# Patient Record
Sex: Female | Born: 2000 | Race: White | Hispanic: No | Marital: Single | Smoking: Never smoker
Health system: Southern US, Community
[De-identification: ages and names within clinical notes are randomized; demographics above are authoritative.]

## PROBLEM LIST (undated history)

## (undated) DIAGNOSIS — F419 Anxiety disorder, unspecified: Secondary | ICD-10-CM

## (undated) DIAGNOSIS — F32A Depression, unspecified: Secondary | ICD-10-CM

## (undated) DIAGNOSIS — B009 Herpesviral infection, unspecified: Secondary | ICD-10-CM

## (undated) HISTORY — DX: Herpesviral infection, unspecified: B00.9

## (undated) HISTORY — PX: WISDOM TOOTH EXTRACTION: SHX21

## (undated) HISTORY — DX: Depression, unspecified: F32.A

## (undated) HISTORY — DX: Anxiety disorder, unspecified: F41.9

---
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
## 2019-04-05 ENCOUNTER — Other Ambulatory Visit: Payer: Self-pay | Admitting: Psychiatry

## 2019-04-11 ENCOUNTER — Other Ambulatory Visit: Payer: Self-pay

## 2019-04-11 ENCOUNTER — Encounter: Payer: Self-pay | Admitting: Psychiatry

## 2019-04-11 ENCOUNTER — Ambulatory Visit (INDEPENDENT_AMBULATORY_CARE_PROVIDER_SITE_OTHER): Payer: 59 | Admitting: Psychiatry

## 2019-04-11 VITALS — Ht 63.0 in | Wt 130.0 lb

## 2019-04-11 DIAGNOSIS — F411 Generalized anxiety disorder: Secondary | ICD-10-CM

## 2019-04-11 DIAGNOSIS — F325 Major depressive disorder, single episode, in full remission: Secondary | ICD-10-CM

## 2019-04-11 MED ORDER — ESCITALOPRAM OXALATE 10 MG PO TABS: 10.0000 mg | ORAL_TABLET | Freq: Every day | ORAL | 1 refills | Status: DC
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
## 2019-06-25 ENCOUNTER — Other Ambulatory Visit: Payer: Self-pay

## 2019-06-25 ENCOUNTER — Ambulatory Visit: Payer: Managed Care, Other (non HMO) | Admitting: Obstetrics & Gynecology

## 2019-06-25 ENCOUNTER — Encounter: Payer: Self-pay | Admitting: Obstetrics & Gynecology

## 2019-06-25 VITALS — BP 118/76

## 2019-06-25 DIAGNOSIS — Z113 Encounter for screening for infections with a predominantly sexual mode of transmission: Secondary | ICD-10-CM

## 2019-06-25 DIAGNOSIS — A6000 Herpesviral infection of urogenital system, unspecified: Secondary | ICD-10-CM

## 2019-06-25 DIAGNOSIS — N766 Ulceration of vulva: Secondary | ICD-10-CM | POA: Diagnosis not present

## 2019-06-25 MED ORDER — VALACYCLOVIR HCL 1 G PO TABS: 1000.0000 mg | ORAL_TABLET | Freq: Every day | ORAL | 0 refills | Status: AC

## 2019-06-25 MED ORDER — VALACYCLOVIR HCL 500 MG PO TABS: 500.0000 mg | ORAL_TABLET | Freq: Every day | ORAL | 4 refills | Status: DC

## 2019-06-25 MED ORDER — ACYCLOVIR 5 % EX OINT: 1.0000 "application " | TOPICAL_OINTMENT | CUTANEOUS | 4 refills | Status: DC

## 2019-06-25 NOTE — Addendum Note (Signed)
Addended by: Thurnell Garbe A on: 06/25/2019 03:45 PM   Modules accepted: Orders

## 2019-06-25 NOTE — Progress Notes (Signed)
    [place] 02/02/2001 [id number]        17 y.o.  G0 Single.  Freshman in [place].  RP: Painful vulvar ulcers x 5 days  HPI: Had IC for the first time 8 days ago.  Condom used.  Developed painful ulcers on the vulva 3 days later, since then, the number of ulcers has increased and they are very painful today.  Painful when walking, painful when sitting.  Able to pass urine without pain.  No pelvic pain.  No abnormal vaginal discharge.  No fever.   OB History  Gravida Para Term Preterm AB Living  0 0 0 0 0 0  SAB TAB Ectopic Multiple Live Births  0 0 0 0 0    Past medical history,surgical history, problem list, medications, allergies, family history and social history were all reviewed and documented in the EPIC chart.   Directed ROS with pertinent positives and negatives documented in the history of present illness/assessment and plan.  Exam:  Vitals:   06/25/19 1055  BP: 118/76   General appearance:  Normal   Gynecologic exam: Vulva:  Bilateral numerous small/medium ulcers typical of Primo infection with HSV.  HSV sure swab done.  Speculum:  Cervix/Vagina normal.  Gono/Chlam done.   Assessment/Plan:  18 y.o. G0  1. Vulvar ulcer Numerous bilateral vulvar ulcers typical of primary infection with HSV.  Sure swab HSV pending.  2. Primary genital herpes simplex infection Counseling on genital herpes done.  Patient reassured and given encouragement.  Sure Swab HSV pending.  Starting treatment with Valacyclovir 1 g PO daily x 5, then will take Valacyclovir prophylaxis 500 mg daily at least for 1 year.  Acyclovir ointment to apply on vulva until the outbreak is over.  Will do a full STD screen today.  Strict condom use recommended.  Patient will continue BCPs.  3. Screen for STD (sexually transmitted disease) Strict condom use strongly recommended - HIV antibody (with reflex) - RPR - Hepatitis C Antibody - Hepatitis B Surface AntiGEN  Other orders - valACYclovir  (VALTREX) 500 MG tablet; Take 1 tablet (500 mg total) by mouth daily. - valACYclovir (VALTREX) 1000 MG tablet; Take 1 tablet (1,000 mg total) by mouth daily for 5 days. - acyclovir ointment (ZOVIRAX) 5 %; Apply 1 application topically every 3 (three) hours.  Counseling on above issues and coordination of care more than 50% for 25 minutes.  Princess Bruins MD, 11:23 AM 06/25/2019

## 2019-06-25 NOTE — Patient Instructions (Signed)
1. Vulvar ulcer Numerous bilateral vulvar ulcers typical of primary infection with HSV.  Sure swab HSV pending.  2. Primary genital herpes simplex infection Counseling on genital herpes done.  Patient reassured and given encouragement.  Sure Swab HSV pending.  Starting treatment with Valacyclovir 1 g PO daily x 5, then will take Valacyclovir prophylaxis 500 mg daily at least for 1 year.  Acyclovir ointment to apply on vulva until the outbreak is over.  Will do a full STD screen today.  Strict condom use recommended.  Patient will continue BCPs.  3. Screen for STD (sexually transmitted disease) Strict condom use strongly recommended - HIV antibody (with reflex) - RPR - Hepatitis C Antibody - Hepatitis B Surface AntiGEN  Other orders - valACYclovir (VALTREX) 500 MG tablet; Take 1 tablet (500 mg total) by mouth daily. - valACYclovir (VALTREX) 1000 MG tablet; Take 1 tablet (1,000 mg total) by mouth daily for 5 days. - acyclovir ointment (ZOVIRAX) 5 %; Apply 1 application topically every 3 (three) hours.  [place], it was a pleasure seeing you today!  I will inform you of your results as soon as they are available.  Genital Herpes Genital herpes is a common sexually transmitted infection (STI) that is caused by a virus. The virus spreads from person to person through sexual contact. Infection can cause itching, blisters, and sores around the genitals or rectum. Symptoms may last several days and then go away This is called an outbreak. However, the virus remains in your body, so you may have more outbreaks in the future. The time between outbreaks varies and can be months or years. Genital herpes affects men and women. It is particularly concerning for pregnant women because the virus can be passed to the baby during delivery and can cause serious problems. Genital herpes is also a concern for people who have a weak disease-fighting (immune) system. What are the causes? This condition is caused  by the herpes simplex virus (HSV) type 1 or type 2. The virus may spread through:  Sexual contact with an infected person, including vaginal, anal, and oral sex.  Contact with fluid from a herpes sore.  The skin. This means that you can get herpes from an infected partner even if he or she does not have a visible sore or does not know that he or she is infected. What increases the risk? You are more likely to develop this condition if:  You have sex with many partners.  You do not use latex condoms during sex. What are the signs or symptoms? Most people do not have symptoms (asymptomatic) or have mild symptoms that may be mistaken for other skin problems. Symptoms may include:  Small red bumps near the genitals, rectum, or mouth. These bumps turn into blisters and then turn into sores.  Flu-like symptoms, including: ? Fever. ? Body aches. ? Swollen lymph nodes. ? Headache.  Painful urination.  Pain and itching in the genital area or rectal area.  Vaginal discharge.  Tingling or shooting pain in the legs and buttocks. Generally, symptoms are more severe and last longer during the first (primary) outbreak. Flu-like symptoms are also more common during the primary outbreak. How is this diagnosed? Genital herpes may be diagnosed based on:  A physical exam.  Your medical history.  Blood tests.  A test of a fluid sample (culture) from an open sore. How is this treated? There is no cure for this condition, but treatment with antiviral medicines that are taken by mouth (  orally) can do the following:  Speed up healing and relieve symptoms.  Help to reduce the spread of the virus to sexual partners.  Limit the chance of future outbreaks, or make future outbreaks shorter.  Lessen symptoms of future outbreaks. Your health care provider may also recommend pain relief medicines, such as aspirin or ibuprofen. Follow these instructions at home: Sexual activity  Do not have  sexual contact during active outbreaks.  Practice safe sex. Latex condoms and female condoms may help prevent the spread of the herpes virus. General instructions  Keep the affected areas dry and clean.  Take over-the-counter and prescription medicines only as told by your health care provider.  Avoid rubbing or touching blisters and sores. If you do touch blisters or sores: ? Wash your hands thoroughly with soap and water. ? Do not touch your eyes afterward.  To help relieve pain or itching, you may take the following actions as directed by your health care provider: ? Apply a cold, wet cloth (cold compress) to affected areas 4-6 times a day. ? Apply a substance that protects your skin and reduces bleeding (astringent). ? Apply a gel that helps relieve pain around sores (lidocaine gel). ? Take a warm, shallow bath that cleans the genital area (sitz bath).  Keep all follow-up visits as told by your health care provider. This is important. How is this prevented?  Use condoms. Although anyone can get genital herpes during sexual contact, even with the use of a condom, a condom can provide some protection.  Avoid having multiple sexual partners.  Talk with your sexual partner about any symptoms either of you may have. Also, talk with your partner about any history of STIs.  Get tested for STIs before you have sex. Ask your partner to do the same.  Do not have sexual contact if you have symptoms of genital herpes. Contact a health care provider if:  Your symptoms are not improving with medicine.  Your symptoms return.  You have new symptoms.  You have a fever.  You have abdominal pain.  You have redness, swelling, or pain in your eye.  You notice new sores on other parts of your body.  You are a woman and experience bleeding between menstrual periods.  You have had herpes and you become pregnant or plan to become pregnant. Summary  Genital herpes is a common sexually  transmitted infection (STI) that is caused by the herpes simplex virus (HSV) type 1 or type 2.  These viruses are most often spread through sexual contact with an infected person.  You are more likely to develop this condition if you have sex with many partners or you have unprotected sex.  Most people do not have symptoms (asymptomatic) or have mild symptoms that may be mistaken for other skin problems. Symptoms occur as outbreaks that may happen months or years apart.  There is no cure for this condition, but treatment with oral antiviral medicines can reduce symptoms, reduce the chance of spreading the virus to a partner, prevent future outbreaks, or shorten future outbreaks. This information is not intended to replace advice given to you by your health care provider. Make sure you discuss any questions you have with your health care provider. Document Released: 10/15/2000 Document Revised: 04/24/2018 Document Reviewed: 09/17/2016 Elsevier Patient Education  2020 [employer]

## 2019-06-26 ENCOUNTER — Telehealth: Payer: Self-pay | Admitting: *Deleted

## 2019-06-26 ENCOUNTER — Other Ambulatory Visit: Payer: Self-pay | Admitting: Obstetrics & Gynecology

## 2019-06-26 LAB — C. TRACHOMATIS/N. GONORRHOEAE RNA
C. trachomatis RNA, TMA: DETECTED — AB
N. gonorrhoeae RNA, TMA: NOT DETECTED

## 2019-06-26 LAB — HEPATITIS C ANTIBODY
Hepatitis C Ab: NONREACTIVE
SIGNAL TO CUT-OFF: 0.02 (ref <1.00)

## 2019-06-26 LAB — HEPATITIS B SURFACE ANTIGEN: Hepatitis B Surface Ag: NONREACTIVE

## 2019-06-26 LAB — HIV ANTIBODY (ROUTINE TESTING W REFLEX): HIV 1&2 Ab, 4th Generation: NONREACTIVE

## 2019-06-26 LAB — RPR: RPR Ser Ql: NONREACTIVE

## 2019-06-26 NOTE — Telephone Encounter (Signed)
PA done via cover my meds website for acyclovir ointment 5%, medication was denied by [facility] not a covered medication. Recommend medication is valacyclovir. Pharmacy informed. QASUOR:[id number]

## 2019-06-28 ENCOUNTER — Other Ambulatory Visit: Payer: Self-pay | Admitting: *Deleted

## 2019-06-28 MED ORDER — AZITHROMYCIN 500 MG PO TABS: ORAL_TABLET | ORAL | 0 refills | Status: DC

## 2019-06-29 ENCOUNTER — Telehealth: Payer: Self-pay | Admitting: Obstetrics and Gynecology

## 2019-06-29 LAB — SURESWAB HSV, TYPE 1/2 DNA, PCR
HSV 1 DNA: DETECTED — AB
HSV 2 DNA: NOT DETECTED

## 2019-06-29 MED ORDER — LIDOCAINE 5 % EX OINT: 1.0000 "application " | TOPICAL_OINTMENT | Freq: Three times a day (TID) | CUTANEOUS | 1 refills | Status: DC

## 2019-06-29 NOTE — Telephone Encounter (Signed)
Phone call from patient regarding insurance not covering Acyclovir ointment.   She is having pain and discomfort from her herpes outbreak, and she wants some relief.  She is already taking oral Valtrex.   I send in a prescription for Lidocaine 5% ointment to use tid prn.  I recommended good handwashing technique after use of the ointment.

## 2019-07-18 ENCOUNTER — Telehealth: Payer: Self-pay

## 2019-07-18 NOTE — Telephone Encounter (Signed)
The pharmacy caller said they had sent several faxes about this but this is the first I have seen of it.  Dr. Durene Cal prescribed Lidocaine 5% ointment for patient on 06/29/19.  Pharmacy said a 2.5 gram tube was prescribed but they do not have that.  What they have in stock that is available is a 35.44 gram tube.  They want to know if okay to fill with that size tube?

## 2019-07-18 NOTE — Telephone Encounter (Signed)
Yes agree with a 35.44 gram tube.

## 2019-07-19 MED ORDER — LIDOCAINE 5 % EX OINT: 1.0000 "application " | TOPICAL_OINTMENT | Freq: Three times a day (TID) | CUTANEOUS | 1 refills | Status: DC

## 2019-07-19 NOTE — Telephone Encounter (Signed)
Rx sent 

## 2019-07-23 ENCOUNTER — Ambulatory Visit: Payer: Managed Care, Other (non HMO) | Admitting: Obstetrics & Gynecology

## 2019-07-23 ENCOUNTER — Other Ambulatory Visit: Payer: Self-pay

## 2019-07-23 ENCOUNTER — Encounter: Payer: Self-pay | Admitting: Obstetrics & Gynecology

## 2019-07-23 VITALS — BP 112/70

## 2019-07-23 DIAGNOSIS — Z8619 Personal history of other infectious and parasitic diseases: Secondary | ICD-10-CM | POA: Diagnosis not present

## 2019-07-23 DIAGNOSIS — A749 Chlamydial infection, unspecified: Secondary | ICD-10-CM | POA: Diagnosis not present

## 2019-07-23 DIAGNOSIS — Z23 Encounter for immunization: Secondary | ICD-10-CM

## 2019-07-23 NOTE — Patient Instructions (Signed)
1. Positive Chlamyida test Chlam positive 06/25/2019.  Treated with Azithro 1 g PO x 1 dose.  No Sx of PID before treatment or after.  TOC Chlam done today.  Currently abstinent.  Will practice strict condom use in the future. - C. trachomatis/N. gonorrhoeae RNA  2. H/O herpes genitalis Treated with Valacyclovir, now on Valacyclovir prophylaxis 500 mg daily.  No vulvar lesion.  3. Flu vaccine need Flu shot given.  Other orders - Flu Vaccine QUAD 36+ mos IM (Fluarix, Quad PF)  Tina [place], it was a pleasure seeing you today!  I will inform you of your results as soon as they are available.

## 2019-07-23 NOTE — Progress Notes (Signed)
    [place] [birth year] [id number]        17 y.o.  G0 Single.  Freshman at [employer] .  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  Full STI screen done at that visit showed Chlam Positive, asymptomatic for Chlam/PID.  Treated with Azithro 1 g PO x 1 dose.  No pelvic pain.  Normal vaginal secretions.  No fever.  Well on BCPs.  Currently abstinent.   OB History  Gravida Para Term Preterm AB Living  0 0 0 0 0 0  SAB TAB Ectopic Multiple Live Births  0 0 0 0 0    Past medical history,surgical history, problem list, medications, allergies, family history and social history were all reviewed and documented in the EPIC chart.   Directed ROS with pertinent positives and negatives documented in the history of present illness/assessment and plan.  Exam:  Vitals:   07/23/19 1210  BP: 112/70   General appearance:  Normal  Abdomen: Normal  Gynecologic exam:  Vulva normal.  Speculum:  Cervix/Vagina normal.  Normal vaginal secretions.  Gono-Chlam done.   Assessment/Plan:  18 y.o. G0  1. Positive Chlamyida test Chlam positive 06/25/2019.  Treated with Azithro 1 g PO x 1 dose.  No Sx of PID before treatment or after.  TOC Chlam done today.  Currently abstinent.  Will practice strict condom use in the future. - C. trachomatis/N. gonorrhoeae RNA  2. H/O herpes genitalis Treated with Valacyclovir, now on Valacyclovir prophylaxis 500 mg daily.  No vulvar lesion.  3. Flu vaccine need Flu shot given.  Other orders - Flu Vaccine QUAD 36+ mos IM (Fluarix, Quad PF)  Counseling on above issues and coordination of care more than 50% for 15 minutes.  Princess Bruins MD, 12:33 PM 07/23/2019

## 2019-07-25 LAB — C. TRACHOMATIS/N. GONORRHOEAE RNA
C. trachomatis RNA, TMA: DETECTED — AB
N. gonorrhoeae RNA, TMA: NOT DETECTED

## 2019-07-30 ENCOUNTER — Other Ambulatory Visit: Payer: Self-pay

## 2019-07-30 ENCOUNTER — Telehealth: Payer: Self-pay | Admitting: *Deleted

## 2019-07-30 MED ORDER — DOXYCYCLINE MONOHYDRATE 100 MG PO CAPS: 100.0000 mg | ORAL_CAPSULE | Freq: Two times a day (BID) | ORAL | 0 refills | Status: DC

## 2019-07-30 NOTE — Telephone Encounter (Signed)
Patient called requesting doxycycline 100 mg tablet sent to [facility]. Rx sent.

## 2019-09-03 ENCOUNTER — Other Ambulatory Visit: Payer: Self-pay

## 2019-09-03 ENCOUNTER — Ambulatory Visit: Payer: Managed Care, Other (non HMO) | Admitting: Obstetrics & Gynecology

## 2019-09-03 ENCOUNTER — Encounter: Payer: Self-pay | Admitting: Obstetrics & Gynecology

## 2019-09-03 VITALS — BP 118/76

## 2019-09-03 DIAGNOSIS — A749 Chlamydial infection, unspecified: Secondary | ICD-10-CM

## 2019-09-03 DIAGNOSIS — Z3041 Encounter for surveillance of contraceptive pills: Secondary | ICD-10-CM

## 2019-09-03 NOTE — Progress Notes (Signed)
    Tina Cobb 04-May-2001 [id number]        18 y.o.  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  No recurrence since then.  Full STI screen done at that visit 06/2019 showed Chlam Positive, asymptomatic for Chlam/PID.  Treated with Azithro 1 g PO x 1 dose in 06/2019.  Then had a TOC 07/2019 which was positive again.  Treated with Doxycycline 100 mg BID x 14 days.  No pelvic pain.  Normal vaginal secretions.  No fever.  Well on BCPs.  Abstinent since last treatment.   OB History  Gravida Para Term Preterm AB Living  0 0 0 0 0 0  SAB TAB Ectopic Multiple Live Births  0 0 0 0 0    Past medical history,surgical history, problem list, medications, allergies, family history and social history were all reviewed and documented in the EPIC chart.   Directed ROS with pertinent positives and negatives documented in the history of present illness/assessment and plan.  Exam:  Vitals:   09/03/19 0937  BP: 118/76   General appearance:  Normal  Abdomen: Normal  Gynecologic exam: Vulva normal.  Speculum: Cervix and vagina normal.  Normal secretions.  Chlamydia testing done on the cervix.   Assessment/Plan:  18 y.o. G0  1. Positive Chlamyida test Test of cure for chlamydia positive.  Treated with doxycycline 100 mg twice a day for 14 days.  Abstinent since then.  Chlamydia and gonorrhea testing done on the cervix.  Strict condom use strongly recommended in the future.  2. Encounter for surveillance of contraceptive pills Well on birth control pills.  Will continue.  Other orders - C. trachomatis/N. gonorrhoeae RNA  Counseling on above issues and coordination of care more than 50% for 15 minutes.  Princess Bruins MD, 9:41 AM 09/03/2019

## 2019-09-03 NOTE — Patient Instructions (Signed)
1. Positive Chlamyida test Test of cure for chlamydia positive.  Treated with doxycycline 100 mg twice a day for 14 days.  Abstinent since then.  Chlamydia and gonorrhea testing done on the cervix.  Strict condom use strongly recommended in the future.  2. Encounter for surveillance of contraceptive pills Well on birth control pills.  Will continue.  Other orders - C. trachomatis/N. gonorrhoeae RNA  [place], it was a pleasure seeing you today!  I will inform you of your results as soon as they are available.

## 2019-09-04 LAB — C. TRACHOMATIS/N. GONORRHOEAE RNA
C. trachomatis RNA, TMA: NOT DETECTED
N. gonorrhoeae RNA, TMA: NOT DETECTED

## 2019-09-26 ENCOUNTER — Ambulatory Visit (INDEPENDENT_AMBULATORY_CARE_PROVIDER_SITE_OTHER): Payer: Managed Care, Other (non HMO) | Admitting: Obstetrics & Gynecology

## 2019-09-26 ENCOUNTER — Other Ambulatory Visit: Payer: Self-pay

## 2019-09-26 ENCOUNTER — Encounter: Payer: Self-pay | Admitting: Obstetrics & Gynecology

## 2019-09-26 VITALS — BP 110/70 | Ht 62.0 in | Wt 109.0 lb

## 2019-09-26 DIAGNOSIS — Z8619 Personal history of other infectious and parasitic diseases: Secondary | ICD-10-CM | POA: Diagnosis not present

## 2019-09-26 DIAGNOSIS — Z01419 Encounter for gynecological examination (general) (routine) without abnormal findings: Secondary | ICD-10-CM | POA: Diagnosis not present

## 2019-09-26 DIAGNOSIS — Z3041 Encounter for surveillance of contraceptive pills: Secondary | ICD-10-CM | POA: Diagnosis not present

## 2019-09-26 MED ORDER — HAILEY 24 FE 1-20 MG-MCG(24) PO TABS: 1.0000 | ORAL_TABLET | Freq: Every day | ORAL | 4 refills | Status: DC

## 2019-09-26 NOTE — Patient Instructions (Signed)
1. Well female exam with routine gynecological exam Normal gynecologic exam.  Will start Pap test at age 18.  Breast exam normal.  Very good body mass index at 19.94.  Continue with fitness, patient is a dance major in college, recommend adding small weight lifting every 2 days.  Continue with healthy nutrition.  2. Encounter for surveillance of contraceptive pills Well on norethindrone estradiol FE 1/20 birth control pill.  No contraindication to continue.  Prescription sent to pharmacy.  3. H/O chlamydia infection Test of cure negative after treatment September 03, 2019.  Abstinent since then.  Strict condom use strongly recommended.  4. H/O herpes genitalis No recurrence of genital herpes on valacyclovir prophylaxis 500 mg daily.  Patient will continue on prophylaxis.  No need for represcription at this time.  Other orders - Norethindrone Acetate-Ethinyl Estrad-FE (HAILEY 24 FE) 1-20 MG-MCG(24) tablet; Take 1 tablet by mouth daily.  [place], it was a pleasure seeing you today!

## 2019-09-26 NOTE — Progress Notes (Signed)
Tina Cobb May 12, 2001 [id number]   History:    18 y.o. G0 Single.  Dance major at [employer]  RP:  Established patient presenting for annual gyn exam   HPI: Well on norethindrone-estradiol FE 1/20.  No breakthrough bleeding.  No pelvic pain.  Test of cure for chlamydia was - September 03, 2019.   Not sexually active since September 03, 2019.  Genital herpes first episode in August 2020, no recurrence on valacyclovir prophylaxis 500 mg daily since then.  The full STD screening was done and otherwise negative August 2020.  Breasts normal.  Urine and bowel movements normal.  Body mass index 19.94.  Danse major in college.  Past medical history,surgical history, family history and social history were all reviewed and documented in the EPIC chart.  Gynecologic History No LMP recorded. (Menstrual status: Oral contraceptives). Contraception: OCP (estrogen/progesterone) Last Pap: Never Last mammogram: Never  Obstetric History OB History  Gravida Para Term Preterm AB Living  0 0 0 0 0 0  SAB TAB Ectopic Multiple Live Births  0 0 0 0 0     ROS: A ROS was performed and pertinent positives and negatives are included in the history.  GENERAL: No fevers or chills. HEENT: No change in vision, no earache, sore throat or sinus congestion. NECK: No pain or stiffness. CARDIOVASCULAR: No chest pain or pressure. No palpitations. PULMONARY: No shortness of breath, cough or wheeze. GASTROINTESTINAL: No abdominal pain, nausea, vomiting or diarrhea, melena or bright red blood per rectum. GENITOURINARY: No urinary frequency, urgency, hesitancy or dysuria. MUSCULOSKELETAL: No joint or muscle pain, no back pain, no recent trauma. DERMATOLOGIC: No rash, no itching, no lesions. ENDOCRINE: No polyuria, polydipsia, no heat or cold intolerance. No recent change in weight. HEMATOLOGICAL: No anemia or easy bruising or bleeding. NEUROLOGIC: No headache, seizures, numbness, tingling or weakness.  PSYCHIATRIC: No depression, no loss of interest in normal activity or change in sleep pattern.     Exam:   BP 110/70   Ht 5\' 2"  (1.575 m)   Wt 109 lb (49.4 kg)   BMI 19.94 kg/m   Body mass index is 19.94 kg/m.  General appearance : Well developed well nourished female. No acute distress HEENT: Eyes: no retinal hemorrhage or exudates,  Neck supple, trachea midline, no carotid bruits, no thyroidmegaly Lungs: Clear to auscultation, no rhonchi or wheezes, or rib retractions  Heart: Regular rate and rhythm, no murmurs or gallops Breast:Examined in sitting and supine position were symmetrical in appearance, no palpable masses or tenderness,  no skin retraction, no nipple inversion, no nipple discharge, no skin discoloration, no axillary or supraclavicular lymphadenopathy Abdomen: no palpable masses or tenderness, no rebound or guarding Extremities: no edema or skin discoloration or tenderness  Pelvic: Vulva: Normal             Vagina: No gross lesions or discharge  Cervix: No gross lesions or discharge  Uterus  AV, normal size, shape and consistency, non-tender and mobile  Adnexa  Without masses or tenderness  Anus: Normal   Assessment/Plan:  18 y.o. female for annual exam   1. Well female exam with routine gynecological exam Normal gynecologic exam.  Will start Pap test at age 12.  Breast exam normal.  Very good body mass index at 19.94.  Continue with fitness, patient is a dance major in college, recommend adding small weight lifting every 2 days.  Continue with healthy nutrition.  2. Encounter for surveillance of contraceptive pills Well on norethindrone  estradiol FE 1/20 birth control pill.  No contraindication to continue.  Prescription sent to pharmacy.  3. H/O chlamydia infection Test of cure negative after treatment September 03, 2019.  Abstinent since then.  Strict condom use strongly recommended.  4. H/O herpes genitalis No recurrence of genital herpes on valacyclovir  prophylaxis 500 mg daily.  Patient will continue on prophylaxis.  No need for represcription at this time.  Other orders - Norethindrone Acetate-Ethinyl Estrad-FE (HAILEY 24 FE) 1-20 MG-MCG(24) tablet; Take 1 tablet by mouth daily.  Princess Bruins MD, 3:26 PM 09/26/2019

## 2019-10-11 ENCOUNTER — Encounter: Payer: Self-pay | Admitting: Psychiatry

## 2019-10-11 ENCOUNTER — Other Ambulatory Visit: Payer: Self-pay

## 2019-10-11 ENCOUNTER — Ambulatory Visit (INDEPENDENT_AMBULATORY_CARE_PROVIDER_SITE_OTHER): Payer: Managed Care, Other (non HMO) | Admitting: Psychiatry

## 2019-10-11 VITALS — Ht 63.0 in | Wt 114.0 lb

## 2019-10-11 DIAGNOSIS — F411 Generalized anxiety disorder: Secondary | ICD-10-CM | POA: Diagnosis not present

## 2019-10-11 DIAGNOSIS — F325 Major depressive disorder, single episode, in full remission: Secondary | ICD-10-CM

## 2019-10-11 MED ORDER — ESCITALOPRAM OXALATE 10 MG PO TABS: 10.0000 mg | ORAL_TABLET | Freq: Every day | ORAL | 1 refills | Status: DC

## 2019-10-11 NOTE — Progress Notes (Signed)
[facility]  Patient ID: Tina [place],  MRN: [id number]  PCP: Chales Salmon, MD  Date of Evaluation: 10/11/2019 Time spent:10 minutes from 1505 to 1515  Chief Complaint:  Chief Complaint    Depression; Anxiety      HISTORY/CURRENT STATUS: [place] is seen onsite in office 10 minutes face-to-face individually with consent with epic collateral for psychiatric interview and exam and 27-month evaluation and management of generalized anxiety and major depression in remission.  Patient has not required or participated in psychotherapy with [facility], [facility] or [employer] counseling resources in the interim.  She did start her freshman semester at [employer] living onsite but even her dance class was online as she reviews her limited satisfaction with the college experience shutdown.  She has reduced her weight again 15 pounds after that gain last appointment noting that food is expensive with all the student cafeterias shutdown.  She supplements her exposure desensitization response prevention including in talking on Zoom at college so that she has less generalized and social anxiety and no somatic complaints now.  She has resumed her local employment here this month expected to continue over the holiday season and then on weekends during the upcoming semester at college.  She continues to overthink with her anxiety and finds her exposure on the job to also be helpful for anxiety reduction and containment.  She has no mania, suicidality, psychosis, or delirium but needs to continue the Lexapro especially for anxiety but also the prevention of depressive relapse.   Individual Medical History/ Review of Systems: Changes? :Yes  OCP noted with monitoring by GYN.  Allergies: Bicillin [penicillin g benzathine]  Current Medications:  Current Outpatient Medications:  .  acyclovir ointment (ZOVIRAX) 5 %, Apply 1 application topically every 3 (three) hours., Disp: 30 g, Rfl: 4 .  escitalopram (LEXAPRO)  10 MG tablet, Take 1 tablet (10 mg total) by mouth daily after breakfast., Disp: 90 tablet, Rfl: 1 .  Multiple Vitamin (MULTIVITAMIN) tablet, Take 1 tablet by mouth daily., Disp: , Rfl:  .  Norethindrone Acetate-Ethinyl Estrad-FE (HAILEY 24 FE) 1-20 MG-MCG(24) tablet, Take 1 tablet by mouth daily., Disp: 3 Package, Rfl: 4 .  valACYclovir (VALTREX) 500 MG tablet, Take 1 tablet (500 mg total) by mouth daily., Disp: 90 tablet, Rfl: 4   Medication Side Effects: none  Family Medical/ Social History: Changes? No  MENTAL HEALTH EXAM:  Height 5\' 3"  (1.6 m), weight 114 lb (51.7 kg).Body mass index is 20.19 kg/m. Muscle strengths and tone 5/5, postural reflexes and gait 0/0, and AIMS = 0 with others deferred fo coronavirus shutdown  General Appearance: Casual and Well Groomed  Eye Contact:  Good  Speech:  Clear and Coherent, Normal Rate and Talkative  Volume:  Normal  Mood:  Anxious and Euthymic  Affect:  Congruent, Restricted and Anxious  Thought Process:  Coherent, Goal Directed, Irrelevant and Descriptions of Associations: Intact  Orientation:  Full (Time, Place, and Person)  Thought Content: Rumination   Suicidal Thoughts:  No  Homicidal Thoughts:  No  Memory:  Immediate;   Good Remote;   Good  Judgement:  Good  Insight:  Good  Psychomotor Activity:  Normal and Mannerisms  Concentration:  Concentration: Good and Attention Span: Good  Recall:  Good  Fund of Knowledge: Good  Language: Good  Assets:  Desire for Improvement Intimacy Leisure Time Resilience Vocational/Educational  ADL's:  Intact  Cognition: WNL  Prognosis:  Good    DIAGNOSES:    ICD-10-CM  1. Generalized anxiety disorder  F41.1 escitalopram (LEXAPRO) 10 MG tablet  2. Major depressive disorder, single episode, in full remission with anxious distress ([place])  F32.5 escitalopram (LEXAPRO) 10 MG tablet    Receiving Psychotherapy: No  not required or participated in psychotherapy with Kandace Blitz,  LPC   RECOMMENDATIONS: Psychosupportive psychoeducation addresses patient's still feeling very young though functioning well. In addition to [facility], she is escribed to continue medically necessary Lexapro 10 mg every morning sent as #90 with one refill to [place] for generalized anxiety and major depression. She returns for follow up in 6 months.   Delight Hoh, MD

## 2019-11-09 ENCOUNTER — Other Ambulatory Visit: Payer: Self-pay | Admitting: Psychiatry

## 2019-11-09 DIAGNOSIS — F325 Major depressive disorder, single episode, in full remission: Secondary | ICD-10-CM

## 2019-11-09 DIAGNOSIS — F411 Generalized anxiety disorder: Secondary | ICD-10-CM

## 2019-11-09 NOTE — Telephone Encounter (Signed)
Submitted 12/10

## 2020-01-18 ENCOUNTER — Ambulatory Visit: Payer: Self-pay | Attending: Internal Medicine

## 2020-01-18 DIAGNOSIS — Z23 Encounter for immunization: Secondary | ICD-10-CM

## 2020-01-18 NOTE — Progress Notes (Signed)
   Covid-19 Vaccination Clinic  Name:  Tina [place]    MRN: [id number] DOB: 2001-01-30  01/18/2020  [name] was observed post Covid-19 immunization for 15 minutes without incident. She was provided with Vaccine Information Sheet and instruction to access the V-Safe system.   [name] was instructed to call 911 with any severe reactions post vaccine: Marland Kitchen Difficulty breathing  . Swelling of face and throat  . A fast heartbeat  . A bad rash all over body  . Dizziness and weakness   Immunizations Administered    Name Date Dose VIS Date Route   Pfizer COVID-19 Vaccine 01/18/2020 12:50 PM 0.3 mL 10/12/2019 Intramuscular   Manufacturer: [employer], [employer]   Lot: [id number]   NDC: [address]-0

## 2020-02-12 ENCOUNTER — Ambulatory Visit: Payer: Self-pay | Attending: Internal Medicine

## 2020-02-12 DIAGNOSIS — Z23 Encounter for immunization: Secondary | ICD-10-CM

## 2020-02-12 NOTE — Progress Notes (Signed)
   Covid-19 Vaccination Clinic  Name:  Tina Cobb    MRN: [id number] DOB: [birth year]  02/12/2020  Tina Cobb was observed post Covid-19 immunization for 15 minutes without incident. She was provided with Vaccine Information Sheet and instruction to access the V-Safe system.   Tina [place] was instructed to call 911 with any severe reactions post vaccine: Marland Kitchen Difficulty breathing  . Swelling of face and throat  . A fast heartbeat  . A bad rash all over body  . Dizziness and weakness   Immunizations Administered    Name Date Dose VIS Date Route   Pfizer COVID-19 Vaccine 02/12/2020  1:34 PM 0.3 mL 10/12/2019 Intramuscular   Manufacturer: [employer], [employer]   Lot: [id number]   NDC: [address]-9

## 2020-04-10 ENCOUNTER — Ambulatory Visit (INDEPENDENT_AMBULATORY_CARE_PROVIDER_SITE_OTHER): Payer: Managed Care, Other (non HMO) | Admitting: Psychiatry

## 2020-04-10 ENCOUNTER — Other Ambulatory Visit: Payer: Self-pay

## 2020-04-10 ENCOUNTER — Encounter: Payer: Self-pay | Admitting: Psychiatry

## 2020-04-10 VITALS — Ht 64.0 in | Wt 118.0 lb

## 2020-04-10 DIAGNOSIS — F411 Generalized anxiety disorder: Secondary | ICD-10-CM

## 2020-04-10 DIAGNOSIS — F330 Major depressive disorder, recurrent, mild: Secondary | ICD-10-CM | POA: Diagnosis not present

## 2020-04-10 MED ORDER — ESCITALOPRAM OXALATE 10 MG PO TABS: 15.0000 mg | ORAL_TABLET | Freq: Every day | ORAL | 0 refills | Status: DC

## 2020-04-10 NOTE — Progress Notes (Signed)
[facility]  Patient ID: Tina [place],  MRN: [id number]  PCP: Harrie Jeans, MD  Date of Evaluation: 04/10/2020 Time spent:20 minutes from 1410 to 1430  Chief Complaint:  Chief Complaint    Depression; Anxiety      HISTORY/CURRENT STATUS: Tina [place] is seen onsite in office 20 minutes face-to-face individually with consent with epic collateral arriving 10 minutes late to the office for psychiatric interview and exam in 48-month evaluation and management of recurrent depression, generalized anxiety, and stress of individuation toward youth in transition to adult independence and responsibility from the passive dependence of adolescence.  The patient completed freshman year at [employer] with enforced virtual online education except her dance class did become in person onsite which was helpful even if anxiously stressful.  She expects onsite education upon starting sophomore year in August. She is eating better gaining 4 pounds in the interim but is somewhat more depressed concluding by review that this is not just situational but relapsing.  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 She does not want to get out of bed in the morning and is sleeping more being depressed.  She hopes that increasing Lexapro may facilitate her social adaptability and mood to satisfaction and preparation for rewarding investment in upcoming school year rather than passive externalized control over her life.  She has no mania, suicidality, psychosis, or delirium.  Depression      The patient presents with depression.  This is a recurrent problem.  The current episode started more than 1 year ago.   The onset quality is gradual.   The problem  occurs intermittently.  The problem has been gradually worsening since onset.  Associated symptoms include fatigue, helplessness, hopelessness, decreased interest and sad.  Associated symptoms include no suicidal ideas.     The symptoms are aggravated by work stress.  Past treatments include SSRIs - Selective serotonin reuptake inhibitors and other medications.  Compliance with treatment is variable.  Past compliance problems include medication issues.  Risk factors include a change in medication usage/dosage, family history of mental illness, history of suicide attempt, prior psychiatric admission and stress.   Past medical history includes anxiety, depression, mental health disorder and suicide attempts.     Pertinent negatives include no life-threatening condition, no physical disability, no recent psychiatric admission, no bipolar disorder and no head trauma.   Individual Medical History/ Review of Systems: Changes? :Yes Weight gain of 4 pounds since last appointment 6 months ago continuing birth control pill/GYN treatments.  Allergies: Bicillin [penicillin g benzathine]  Current Medications:  Current Outpatient Medications:  .  acyclovir ointment (ZOVIRAX) 5 %, Apply 1 application topically every 3 (three) hours., Disp: 30 g, Rfl: 4 .  escitalopram (LEXAPRO) 10 MG tablet, Take 1.5 tablets (15 mg total) by mouth daily after breakfast., Disp: 135 tablet, Rfl: 0 .  Multiple Vitamin (MULTIVITAMIN) tablet, Take 1 tablet by mouth daily., Disp: , Rfl:  .  Norethindrone Acetate-Ethinyl Estrad-FE (HAILEY 24 FE) 1-20 MG-MCG(24) tablet, Take 1 tablet by mouth daily., Disp: 3 Package, Rfl: 4 .  valACYclovir (VALTREX) 500 MG tablet, Take 1 tablet (500 mg total) by mouth daily., Disp: 90 tablet, Rfl: 4  Medication Side Effects: none  Family Medical/ Social History: Changes?  No  MENTAL HEALTH EXAM:  Height 5\' 4"  (1.626 m), weight 118 lb (53.5 kg).Body mass index is 20.25 kg/m. Muscle strengths and tone  5/5, postural reflexes and gait 0/0, and AIMS = 0.  General Appearance: Casual, Meticulous and Well Groomed, Guarded  Eye Contact:  Good  Speech:  Clear and Coherent and Normal Rate  Volume:  Normal  Mood:  Anxious, Depressed, Dysphoric, Euthymic and Hopeless  Affect:  Congruent, Depressed, Inappropriate, Restricted and Anxious  Thought Process:  Coherent, Goal Directed, Irrelevant and Descriptions of Associations: Circumstantial  Orientation:  Full (Time, Place, and Person)  Thought Content: Obsessions and Rumination   Suicidal Thoughts:  No  Homicidal Thoughts:  No  Memory:  Immediate;   Good Remote;   Good  Judgement:  Good  Insight:  Good  Psychomotor Activity:  Normal, Decreased, Mannerisms and Psychomotor Retardation  Concentration:  Concentration: Good and Attention Span: Good  Recall:  Fair  Fund of Knowledge: Good  Language: Good  Assets:  Desire for Improvement Intimacy Talents and skills Vocational/Educational  ADL's:  Intact  Cognition: WNL  Prognosis:  Good    DIAGNOSES:    ICD-10-CM   1. Mild recurrent major depression (HCC)  F33.0 escitalopram (LEXAPRO) 10 MG tablet  2. Generalized anxiety disorder  F41.1 escitalopram (LEXAPRO) 10 MG tablet    Receiving Psychotherapy: No     RECOMMENDATIONS: Over 50% of the 20-minute face-to-face session for total of 10 minutes in counseling and coordination of care reestablishing from previous therapeutic success current fulfillment for workplace, sophomore year of college, and subsequent future to be less vulnerable to anxious avoidance and more satisfying in her confidence in daily responsibility and capability.  She has not yet to return to the option of psychotherapy but understands resources for such.  She prefers to adjust the Lexapro titrated up to Lexapro 10 mg tablet to take 1.5 tablets total 15 mg daily after breakfast #135 with no refills sent to [facility] for an anxiety.  Agrees to return for  follow-up in 7 weeks before the start of sophomore year of school or sooner if needed.  Included to return to [facility], MD

## 2020-05-29 ENCOUNTER — Other Ambulatory Visit: Payer: Self-pay

## 2020-05-29 ENCOUNTER — Ambulatory Visit (INDEPENDENT_AMBULATORY_CARE_PROVIDER_SITE_OTHER): Payer: 59 | Admitting: Psychiatry

## 2020-05-29 ENCOUNTER — Encounter: Payer: Self-pay | Admitting: Psychiatry

## 2020-05-29 VITALS — Ht 64.0 in | Wt 116.0 lb

## 2020-05-29 DIAGNOSIS — F411 Generalized anxiety disorder: Secondary | ICD-10-CM | POA: Diagnosis not present

## 2020-05-29 DIAGNOSIS — F330 Major depressive disorder, recurrent, mild: Secondary | ICD-10-CM

## 2020-05-29 MED ORDER — ESCITALOPRAM OXALATE 10 MG PO TABS: 15.0000 mg | ORAL_TABLET | Freq: Every day | ORAL | 2 refills | Status: DC

## 2020-05-29 NOTE — Progress Notes (Signed)
[facility]  Patient ID: Tina Cobb,  MRN: [id number]  PCP: Chales Salmon, MD  Date of Evaluation: 05/29/2020 Time spent:15 minutes from 1040 to 1055  Chief Complaint:  Chief Complaint    Depression; Anxiety      HISTORY/CURRENT STATUS: Tina [place] is seen Onsite in office 15 minutes face-to-face individually with consent with epic collateral for psychiatric interview and exam in 6-week evaluation and management of major depression and generalized anxiety now noting some difficulty sleeping that she predicts will improve as she moves on campus to [employer] August 13.  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 Patient does note improvement in mood and anxiety though not resolution of the difficulty getting out of bed in the morning.  She still has some difficulty sleeping but does not consider any treatment other than melatonin or moving to college necessary at this time.  However she does conclude that she can and will now push herself more to out of bed in the morning and make most of the day to see if she feels a sense of accomplishment in her work by the end of the day.  This is reinforced by patient receiving a $50 bonus award at the sports bar employee banquet/work meeting.  She states this was unexpected but helps her realize to appreciate herself and her work.  Sports bar is in [place], and she will soon be going back to school in [place]. She feels prepared starting her sophomore year moving back in the dorms hoping for a better social experience than last time.  She does feel the sophomore year will be more challenging but also interesting.  She splits the tablet of Lexapro without  difficulty buying a pill splitter, and she is to continue the medication as currently underway.  She has no mania, suicidality, psychosis or delirium..   Individual Medical History/ Review of Systems: Changes? :No Weight is down 2 pounds in 6 weeks  Allergies: Bicillin [penicillin g benzathine]  Current Medications:  Current Outpatient Medications:  .  acyclovir ointment (ZOVIRAX) 5 %, Apply 1 application topically every 3 (three) hours., Disp: 30 g, Rfl: 4 .  escitalopram (LEXAPRO) 10 MG tablet, Take 1.5 tablets (15 mg total) by mouth daily after breakfast., Disp: 135 tablet, Rfl: 2 .  Multiple Vitamin (MULTIVITAMIN) tablet, Take 1 tablet by mouth daily., Disp: , Rfl:  .  Norethindrone Acetate-Ethinyl Estrad-FE (HAILEY 24 FE) 1-20 MG-MCG(24) tablet, Take 1 tablet by mouth daily., Disp: 3 Package, Rfl: 4 .  valACYclovir (VALTREX) 500 MG tablet, Take 1 tablet (500 mg total) by mouth daily., Disp: 90 tablet, Rfl: 4  Medication Side Effects: none  Family Medical/ Social History: Changes? No  MENTAL HEALTH EXAM:  Height 5\' 4"  (1.626 m), weight 116 lb (52.6 kg).Body mass index is 19.91 kg/m. Muscle strengths and tone 5/5, postural reflexes and gait 0/0, and AIMS = 0.  General Appearance: Casual, Fairly Groomed and Meticulous  Eye Contact:  Good  Speech:  Clear and Coherent, Normal Rate and Talkative  Volume:  Normal  Mood:  Anxious, Dysphoric and Euthymic  Affect:  Congruent, Depressed, Inappropriate and Anxious  Thought Process:  Coherent, Goal Directed and Descriptions of Associations: Circumstantial  Orientation:  Full (Time, Place, and Person)  Thought Content: Obsessions and Rumination   Suicidal Thoughts:  No  Homicidal Thoughts:  No  Memory:  Immediate;   Good Remote;   Good  Judgement:  Good  Insight:  Good  Psychomotor Activity:  Normal and Mannerisms  Concentration:  Concentration: Good and Attention Span: Good  Recall:  Good  Fund of Knowledge: Good  Language: Good   Assets:  Desire for Improvement Intimacy Talents/Skills Vocational/Educational  ADL's:  Intact  Cognition: WNL  Prognosis:  Good    DIAGNOSES:    ICD-10-CM   1. Mild recurrent major depression (HCC)  F33.0 escitalopram (LEXAPRO) 10 MG tablet  2. Generalized anxiety disorder  F41.1 escitalopram (LEXAPRO) 10 MG tablet    Receiving Psychotherapy: No    RECOMMENDATIONS: Patient's doubt and cognitive resolution can be reinforced and integrated into cognitive behavioral affective hygiene, behavioral nutrition, social skills, and problem-solving.  She understands the availability of trazodone, clonidine, or temazaepam if needed for insomnia not managed successfully by the melatonin she plans and prepares OTC.  She is E scribed Lexapro 10 mg tablet taking 1.5 tablets total 50 mg every morning sent as #135 with 2 refills to [facility] for major depression and generalized anxiety.  She plans to return for follow-up in 1 year or sooner if needed generalizing the last 2 appointments to aftercare.   Chauncey Mann, MD

## 2020-08-20 ENCOUNTER — Encounter: Payer: Self-pay | Admitting: Psychiatry

## 2020-09-13 ENCOUNTER — Other Ambulatory Visit: Payer: Self-pay | Admitting: Obstetrics & Gynecology

## 2020-09-15 NOTE — Telephone Encounter (Signed)
CE scheduled 10/23/20.

## 2020-09-17 MED ORDER — HAILEY 24 FE 1-20 MG-MCG(24) PO TABS: 1.0000 | ORAL_TABLET | Freq: Every day | ORAL | 0 refills | Status: DC

## 2020-10-23 ENCOUNTER — Other Ambulatory Visit: Payer: Self-pay

## 2020-10-23 ENCOUNTER — Encounter: Payer: Self-pay | Admitting: Obstetrics & Gynecology

## 2020-10-23 ENCOUNTER — Ambulatory Visit (INDEPENDENT_AMBULATORY_CARE_PROVIDER_SITE_OTHER): Payer: Managed Care, Other (non HMO) | Admitting: Obstetrics & Gynecology

## 2020-10-23 VITALS — BP 126/80 | Ht 62.0 in | Wt 106.0 lb

## 2020-10-23 DIAGNOSIS — Z3041 Encounter for surveillance of contraceptive pills: Secondary | ICD-10-CM | POA: Diagnosis not present

## 2020-10-23 DIAGNOSIS — Z01419 Encounter for gynecological examination (general) (routine) without abnormal findings: Secondary | ICD-10-CM

## 2020-10-23 DIAGNOSIS — Z23 Encounter for immunization: Secondary | ICD-10-CM | POA: Diagnosis not present

## 2020-10-23 DIAGNOSIS — Z8619 Personal history of other infectious and parasitic diseases: Secondary | ICD-10-CM | POA: Diagnosis not present

## 2020-10-23 DIAGNOSIS — Z113 Encounter for screening for infections with a predominantly sexual mode of transmission: Secondary | ICD-10-CM

## 2020-10-23 MED ORDER — VALACYCLOVIR HCL 500 MG PO TABS: 500.0000 mg | ORAL_TABLET | Freq: Every day | ORAL | 4 refills | Status: DC

## 2020-10-23 MED ORDER — HAILEY 24 FE 1-20 MG-MCG(24) PO TABS: 1.0000 | ORAL_TABLET | Freq: Every day | ORAL | 4 refills | Status: DC

## 2020-10-23 NOTE — Addendum Note (Signed)
Addended by: Berna Spare A on: 10/23/2020 11:34 AM   Modules accepted: Orders

## 2020-10-23 NOTE — Addendum Note (Signed)
Addended by: Berna Spare A on: 10/23/2020 09:26 AM   Modules accepted: Orders

## 2020-10-23 NOTE — Progress Notes (Addendum)
Tina Cobb [birth year] [id number]   History:    19 y.o. G0 Single.  Careers adviser minor at [employer]  RP:  Established patient presenting for annual gyn exam   HPI: Well on norethindrone-estradiol FE 1/20.  No breakthrough bleeding.  No pelvic pain.  Test of cure for chlamydia was - September 03, 2019.   Not sexually active since September 03, 2019.  Genital herpes first episode in August 2020, no recurrence on valacyclovir prophylaxis 500 mg daily since then.  The full STD screening was done and otherwise negative August 2020.  Breasts normal. Urine and bowel movements normal.  Body mass index 19.39.   Past medical history,surgical history, family history and social history were all reviewed and documented in the EPIC chart.  Gynecologic History Patient's last menstrual period was 10/17/2020.  Obstetric History OB History  Gravida Para Term Preterm AB Living  0 0 0 0 0 0  SAB IAB Ectopic Multiple Live Births  0 0 0 0 0     ROS: A ROS was performed and pertinent positives and negatives are included in the history.  GENERAL: No fevers or chills. HEENT: No change in vision, no earache, sore throat or sinus congestion. NECK: No pain or stiffness. CARDIOVASCULAR: No chest pain or pressure. No palpitations. PULMONARY: No shortness of breath, cough or wheeze. GASTROINTESTINAL: No abdominal pain, nausea, vomiting or diarrhea, melena or bright red blood per rectum. GENITOURINARY: No urinary frequency, urgency, hesitancy or dysuria. MUSCULOSKELETAL: No joint or muscle pain, no back pain, no recent trauma. DERMATOLOGIC: No rash, no itching, no lesions. ENDOCRINE: No polyuria, polydipsia, no heat or cold intolerance. No recent change in weight. HEMATOLOGICAL: No anemia or easy bruising or bleeding. NEUROLOGIC: No headache, seizures, numbness, tingling or weakness. PSYCHIATRIC: No depression, no loss of interest in normal activity or change in sleep pattern.      Exam:   BP 126/80   Ht 5\' 2"  (1.575 m)   Wt 106 lb (48.1 kg)   LMP 10/17/2020 Comment: pill  BMI 19.39 kg/m   Body mass index is 19.39 kg/m.  General appearance : Well developed well nourished female. No acute distress HEENT: Eyes: no retinal hemorrhage or exudates,  Neck supple, trachea midline, no carotid bruits, no thyroidmegaly Lungs: Clear to auscultation, no rhonchi or wheezes, or rib retractions  Heart: Regular rate and rhythm, no murmurs or gallops Breast:Examined in sitting and supine position were symmetrical in appearance, no palpable masses or tenderness,  no skin retraction, no nipple inversion, no nipple discharge, no skin discoloration, no axillary or supraclavicular lymphadenopathy Abdomen: no palpable masses or tenderness, no rebound or guarding Extremities: no edema or skin discoloration or tenderness  Pelvic: Vulva: Normal             Vagina: No gross lesions or discharge  Cervix: No gross lesions or discharge.  Gono-Chlam done  Uterus  AV, normal size, shape and consistency, non-tender and mobile  Adnexa  Without masses or tenderness  Anus: Normal   Assessment/Plan:  19 y.o. female for annual exam   1. Well female exam with routine gynecological exam Normal gynecologic exam.  Will start Pap test at age 36 next year.  Breast exam normal.  Good body mass index at 19.39.  2. Encounter for surveillance of contraceptive pills Well on the generic of Loestrin FE 1/20.  No contraindication to continue.  Prescription sent to pharmacy.  3. H/O herpes genitalis Very mild recurrences on the valacyclovir prophylaxis daily.  No contraindication to continue.  Valacyclovir 500 mg 1 tablet daily.  90 tablets, refill x4 sent to pharmacy.  4. Screen for STD (sexually transmitted disease) Gono-Chlam done.  5. Flu vaccine need Flu shot given.  Other orders - valACYclovir (VALTREX) 500 MG tablet; Take 1 tablet (500 mg total) by mouth daily. - Norethindrone  Acetate-Ethinyl Estrad-FE (HAILEY 24 FE) 1-20 MG-MCG(24) tablet; Take 1 tablet by mouth daily.  Genia Del MD, 9:06 AM 10/23/2020

## 2020-10-24 LAB — C. TRACHOMATIS/N. GONORRHOEAE RNA
C. trachomatis RNA, TMA: NOT DETECTED
N. gonorrhoeae RNA, TMA: NOT DETECTED

## 2020-11-13 MED ORDER — HAILEY 24 FE 1-20 MG-MCG(24) PO TABS: 1.0000 | ORAL_TABLET | Freq: Every day | ORAL | 0 refills | Status: DC

## 2021-03-24 ENCOUNTER — Other Ambulatory Visit: Payer: Self-pay | Admitting: Psychiatry

## 2021-03-24 DIAGNOSIS — F411 Generalized anxiety disorder: Secondary | ICD-10-CM

## 2021-03-24 DIAGNOSIS — F330 Major depressive disorder, recurrent, mild: Secondary | ICD-10-CM

## 2021-03-25 ENCOUNTER — Ambulatory Visit (INDEPENDENT_AMBULATORY_CARE_PROVIDER_SITE_OTHER): Payer: 59 | Admitting: Behavioral Health

## 2021-03-25 ENCOUNTER — Encounter: Payer: Self-pay | Admitting: Behavioral Health

## 2021-03-25 ENCOUNTER — Other Ambulatory Visit: Payer: Self-pay

## 2021-03-25 DIAGNOSIS — F3341 Major depressive disorder, recurrent, in partial remission: Secondary | ICD-10-CM

## 2021-03-25 DIAGNOSIS — F411 Generalized anxiety disorder: Secondary | ICD-10-CM

## 2021-03-25 MED ORDER — ESCITALOPRAM OXALATE 20 MG PO TABS: 20.0000 mg | ORAL_TABLET | Freq: Every day | ORAL | 5 refills | Status: DC

## 2021-03-25 NOTE — Progress Notes (Signed)
[facility]  Patient ID: Tina Cobb,  MRN: [id number]  PCP: Chales Salmon, MD  Date of Evaluation: 03/25/2021 Time spent:20 minutes  Chief Complaint:  Chief Complaint    Anxiety; Depression      HISTORY/CURRENT STATUS: HPI  20 year old female presents to this office for follow up medication management. She is prior patient of Dr. Shelba Flake. Said she had been seeing him since around the age of 53. She says that she is doing very well overall. She says, "I am not sure I have major depressive disorder anymore".  Says I feel a little depressed with anxiety time to time but common for her. She agreed that a small increase in Lexapro may add additional therapy for her moods. 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 Reports anxiety today at 2. Depression at 2. Reports sleeping 7-8 hours per day. No mania, psychosis. No SI/HI.   Past Psychiatric Medications: Zoloft  Individual Medical History/ Review of Systems: Changes? :No   Allergies: Bicillin [penicillin g benzathine]  Current Medications:  Current Outpatient Medications:  .  acyclovir ointment (ZOVIRAX) 5 %, Apply 1 application topically every 3 (three) hours., Disp: 30 g, Rfl: 4 .  escitalopram (LEXAPRO) 20 MG tablet, Take 1 tablet (20 mg total) by mouth daily., Disp: 30 tablet, Rfl: 5 .  Multiple Vitamin (MULTIVITAMIN) tablet, Take 1 tablet by mouth daily., Disp: , Rfl:  .  Norethindrone Acetate-Ethinyl Estrad-FE (HAILEY 24 FE) 1-20 MG-MCG(24) tablet, Take 1 tablet by mouth daily., Disp: 28 tablet, Rfl: 0 .  valACYclovir (VALTREX) 500 MG tablet, Take 1 tablet (500 mg total) by mouth daily., Disp: 90 tablet, Rfl: 4 .  Norethindrone Acetate-Ethinyl Estrad-FE (HAILEY 24 FE) 1-20 MG-MCG(24) tablet, Take 1 tablet by mouth daily. (Patient not taking: Reported on 03/25/2021), Disp: 84 tablet, Rfl: 4 Medication Side Effects: none  Family  Medical/ Social History: Changes? no  MENTAL HEALTH EXAM:  There were no vitals taken for this visit.There is no height or weight on file to calculate BMI.  General Appearance: Casual and Neat  Eye Contact:  Good  Speech:  Clear and Coherent  Volume:  Normal  Mood:  NA  Affect:  Appropriate  Thought Process:  Coherent  Orientation:  Full (Time, Place, and Person)  Thought Content: Logical   Suicidal Thoughts:  No  Homicidal Thoughts:  No  Memory:  WNL  Judgement:  Good  Insight:  Good  Psychomotor Activity:  Normal  Concentration:  Concentration: Good  Recall:  Good  Fund of Knowledge: Good  Language: Good  Assets:  Desire for Improvement  ADL's:  Intact  Cognition: WNL  Prognosis:  Good    DIAGNOSES:    ICD-10-CM   1. Generalized anxiety disorder  F41.1 escitalopram (LEXAPRO) 20 MG tablet  2. Recurrent major depressive disorder, in partial remission (HCC)  F33.41     Receiving Psychotherapy: No    RECOMMENDATIONS:  Will increase dose of Lexapro to 20 mg daily To report worsening condition promptly To follow up in 6 months unless changes in therapy needed. Greater than 50% of face to face time with patient was spent on counseling and coordination of care. We discussed medication risk during pregnancy and continued BC. Patient reports cognitive stability at this time but feels like she could benefit more from  increase in dose of Lexapro. New e-script of Lexapro 20 mg was sent to  her pharmacy. She agreed to 6 month follow up but will call in for sooner appt if needed.     Joan Flores, NP

## 2021-09-11 ENCOUNTER — Other Ambulatory Visit: Payer: Self-pay

## 2021-09-14 NOTE — Telephone Encounter (Signed)
Annual exam scheduled on 11/05/2021  Previous message said patient only needed 1 month supply sent to [place], [place].

## 2021-09-18 ENCOUNTER — Ambulatory Visit: Payer: Self-pay | Admitting: Behavioral Health

## 2021-10-09 ENCOUNTER — Other Ambulatory Visit: Payer: Self-pay | Admitting: Behavioral Health

## 2021-10-09 DIAGNOSIS — F411 Generalized anxiety disorder: Secondary | ICD-10-CM

## 2021-10-21 ENCOUNTER — Encounter: Payer: Self-pay | Admitting: Behavioral Health

## 2021-10-21 ENCOUNTER — Ambulatory Visit (INDEPENDENT_AMBULATORY_CARE_PROVIDER_SITE_OTHER): Payer: 59 | Admitting: Behavioral Health

## 2021-10-21 ENCOUNTER — Other Ambulatory Visit: Payer: Self-pay

## 2021-10-21 DIAGNOSIS — F330 Major depressive disorder, recurrent, mild: Secondary | ICD-10-CM | POA: Diagnosis not present

## 2021-10-21 DIAGNOSIS — F411 Generalized anxiety disorder: Secondary | ICD-10-CM | POA: Diagnosis not present

## 2021-10-21 DIAGNOSIS — F3341 Major depressive disorder, recurrent, in partial remission: Secondary | ICD-10-CM | POA: Diagnosis not present

## 2021-10-21 MED ORDER — BUPROPION HCL ER (XL) 150 MG PO TB24: 150.0000 mg | ORAL_TABLET | Freq: Every day | ORAL | 1 refills | Status: DC

## 2021-10-21 NOTE — Progress Notes (Signed)
[facility]  Patient ID: Tina Cobb,  MRN: [id number]  PCP: Chales Salmon, MD  Date of Evaluation: 10/21/2021 Time spent:20 minutes  Chief Complaint:  Chief Complaint   Anxiety; Depression; Follow-up; Medication Problem     HISTORY/CURRENT STATUS: HPI  20 year old female presents to this office for follow up medication management. She is prior patient of Dr. Shelba Flake. She says that Lexapro increase has helped since last visit but she still struggles with depression more than she would like. She tried to power through but says "it gets rough sometimes".  She would like to try a medication that can help with this breakthrough mild depression. She is also reporting low energy levels most days. Reports anxiety today at 3/10. Depression at 5/10. Reports sleeping 7-8 hours per day. No mania, psychosis. No SI/HI.    Past Psychiatric Medications: Zoloft      Individual Medical History/ Review of Systems: Changes? :No   Allergies: Bicillin [penicillin g benzathine]  Current Medications:  Current Outpatient Medications:    buPROPion (WELLBUTRIN XL) 150 MG 24 hr tablet, Take 1 tablet (150 mg total) by mouth daily., Disp: 30 tablet, Rfl: 1   acyclovir ointment (ZOVIRAX) 5 %, Apply 1 application topically every 3 (three) hours., Disp: 30 g, Rfl: 4   escitalopram (LEXAPRO) 20 MG tablet, TAKE ONE TABLET BY MOUTH DAILY, Disp: 30 tablet, Rfl: 2   Multiple Vitamin (MULTIVITAMIN) tablet, Take 1 tablet by mouth daily., Disp: , Rfl:    Norethindrone Acetate-Ethinyl Estrad-FE (HAILEY 24 FE) 1-20 MG-MCG(24) tablet, Take 1 tablet by mouth daily. (Patient not taking: Reported on 03/25/2021), Disp: 84 tablet, Rfl: 4   Norethindrone Acetate-Ethinyl Estrad-FE (HAILEY 24 FE) 1-20 MG-MCG(24) tablet, Take 1 tablet by mouth daily., Disp: 28 tablet, Rfl: 0   valACYclovir (VALTREX) 500 MG tablet, Take 1 tablet (500 mg total) by mouth daily., Disp: 90 tablet, Rfl: 4 Medication Side  Effects: none  Family Medical/ Social History: Changes? No  MENTAL HEALTH EXAM:  There were no vitals taken for this visit.There is no height or weight on file to calculate BMI.  General Appearance: Casual  Eye Contact:  Good  Speech:  Clear and Coherent  Volume:  Normal  Mood:  Depressed  Affect:  Congruent, Depressed, and Flat  Thought Process:  Coherent  Orientation:  Full (Time, Place, and Person)  Thought Content: Logical   Suicidal Thoughts:  No  Homicidal Thoughts:  No  Memory:  WNL  Judgement:  Good  Insight:  Good  Psychomotor Activity:  Normal  Concentration:  Concentration: Good  Recall:  Good  Fund of Knowledge: Good  Language: Good  Assets:  Desire for Improvement  ADL's:  Intact  Cognition: WNL  Prognosis:  Good    DIAGNOSES:    ICD-10-CM   1. Generalized anxiety disorder  F41.1 buPROPion (WELLBUTRIN XL) 150 MG 24 hr tablet    2. Recurrent major depressive disorder, in partial remission (HCC)  F33.41     3. Mild recurrent major depression (HCC)  F33.0 buPROPion (WELLBUTRIN XL) 150 MG 24 hr tablet      Receiving Psychotherapy: No    RECOMMENDATIONS:    Continue dose of Lexapro 20 mg daily To start Wellbutrin 150 mg XL as adjunct for episodic and lingering depression. She is also reporting low energy. To report worsening condition promptly To follow up in 4 weeks to reassess. Provided emergency contact information Greater than 50% of face to face time with patient was spent on  counseling and coordination of care. We discussed her current level of depression. She is not feeling well right now and is requesting medication that can help. We discussed medication risk during pregnancy and continued BC.   Reviewed PDMP    Elwanda Brooklyn, NP

## 2021-11-01 ENCOUNTER — Other Ambulatory Visit: Payer: Self-pay | Admitting: Obstetrics & Gynecology

## 2021-11-03 NOTE — Telephone Encounter (Signed)
AEX scheduled for 11/05/21.

## 2021-11-05 ENCOUNTER — Other Ambulatory Visit: Payer: Self-pay

## 2021-11-05 ENCOUNTER — Encounter: Payer: Self-pay | Admitting: Obstetrics & Gynecology

## 2021-11-05 ENCOUNTER — Ambulatory Visit (INDEPENDENT_AMBULATORY_CARE_PROVIDER_SITE_OTHER): Payer: 59 | Admitting: Obstetrics & Gynecology

## 2021-11-05 ENCOUNTER — Other Ambulatory Visit (HOSPITAL_COMMUNITY)
Admission: RE | Admit: 2021-11-05 | Discharge: 2021-11-05 | Disposition: A | Discharge status: Home / Self Care | Payer: 59 | Source: Ambulatory Visit | Attending: Obstetrics & Gynecology | Admitting: Obstetrics & Gynecology

## 2021-11-05 VITALS — BP 100/62 | HR 89 | Resp 16 | Ht 62.25 in | Wt 101.0 lb

## 2021-11-05 DIAGNOSIS — Z01419 Encounter for gynecological examination (general) (routine) without abnormal findings: Secondary | ICD-10-CM | POA: Diagnosis present

## 2021-11-05 DIAGNOSIS — Z3041 Encounter for surveillance of contraceptive pills: Secondary | ICD-10-CM

## 2021-11-05 DIAGNOSIS — Z113 Encounter for screening for infections with a predominantly sexual mode of transmission: Secondary | ICD-10-CM | POA: Diagnosis present

## 2021-11-05 DIAGNOSIS — Z8619 Personal history of other infectious and parasitic diseases: Secondary | ICD-10-CM

## 2021-11-05 MED ORDER — HAILEY 24 FE 1-20 MG-MCG(24) PO TABS: 1.0000 | ORAL_TABLET | Freq: Every day | ORAL | 4 refills | Status: DC

## 2021-11-05 MED ORDER — VALACYCLOVIR HCL 500 MG PO TABS: 500.0000 mg | ORAL_TABLET | Freq: Every day | ORAL | 4 refills | Status: DC

## 2021-11-05 NOTE — Progress Notes (Signed)
Tina Cobb [birth year] [id number]   History:    21 y.o.  G0 Single.  Careers adviser minor at [employer]   RP:  Established patient presenting for annual gyn exam    HPI: Well on norethindrone-estradiol FE 1/20.  No breakthrough bleeding.  No pelvic pain.  Not sexually active currently. Genital herpes first episode in August 2020, no recurrence on valacyclovir prophylaxis 500 mg daily since then.  Full STD screening desired today.  Breasts normal. Urine and bowel movements normal.  Body mass index 18.33.   Past medical history,surgical history, family history and social history were all reviewed and documented in the EPIC chart.  Gynecologic History No LMP recorded. (Menstrual status: Oral contraceptives).  Obstetric History OB History  Gravida Para Term Preterm AB Living  0 0 0 0 0 0  SAB IAB Ectopic Multiple Live Births  0 0 0 0 0     ROS: A ROS was performed and pertinent positives and negatives are included in the history.  GENERAL: No fevers or chills. HEENT: No change in vision, no earache, sore throat or sinus congestion. NECK: No pain or stiffness. CARDIOVASCULAR: No chest pain or pressure. No palpitations. PULMONARY: No shortness of breath, cough or wheeze. GASTROINTESTINAL: No abdominal pain, nausea, vomiting or diarrhea, melena or bright red blood per rectum. GENITOURINARY: No urinary frequency, urgency, hesitancy or dysuria. MUSCULOSKELETAL: No joint or muscle pain, no back pain, no recent trauma. DERMATOLOGIC: No rash, no itching, no lesions. ENDOCRINE: No polyuria, polydipsia, no heat or cold intolerance. No recent change in weight. HEMATOLOGICAL: No anemia or easy bruising or bleeding. NEUROLOGIC: No headache, seizures, numbness, tingling or weakness. PSYCHIATRIC: No depression, no loss of interest in normal activity or change in sleep pattern.     Exam:   BP 100/62    Pulse 89    Resp 16    Ht 5' 2.25" (1.581 m)    Wt 101 lb (45.8 kg)     BMI 18.33 kg/m   Body mass index is 18.33 kg/m.  General appearance : Well developed well nourished female. No acute distress HEENT: Eyes: no retinal hemorrhage or exudates,  Neck supple, trachea midline, no carotid bruits, no thyroidmegaly Lungs: Clear to auscultation, no rhonchi or wheezes, or rib retractions  Heart: Regular rate and rhythm, no murmurs or gallops Breast:Examined in sitting and supine position were symmetrical in appearance, no palpable masses or tenderness,  no skin retraction, no nipple inversion, no nipple discharge, no skin discoloration, no axillary or supraclavicular lymphadenopathy Abdomen: no palpable masses or tenderness, no rebound or guarding Extremities: no edema or skin discoloration or tenderness  Pelvic: Vulva: Normal             Vagina: No gross lesions or discharge  Cervix: No gross lesions or discharge.  Pap reflex, Gono-Chlam done.  Uterus  RV, normal size, shape and consistency, non-tender and mobile  Adnexa  Without masses or tenderness  Anus: Normal   Assessment/Plan:  21 y.o. female for annual exam   1. Encounter for routine gynecological examination with Papanicolaou smear of cervix Well on norethindrone-estradiol FE 1/20.  No breakthrough bleeding.  No pelvic pain.  Not sexually active currently. Pap reflex done. Genital herpes first episode in August 2020, no recurrence on valacyclovir prophylaxis 500 mg daily since then.  Full STD screening desired today.  Breasts normal. Urine and bowel movements normal.  Body mass index 18.33. - Cytology - PAP( [place])  2. Encounter for surveillance  of contraceptive pills Well on norethindrone-estradiol FE 1/20.  No breakthrough bleeding.  No pelvic pain. No CI to continue on the BCP.  Prescription sent to pharmacy.  3. Screen for STD (sexually transmitted disease) Strict condom use recommended. - HIV antibody (with reflex) - RPR - Hepatitis B Surface AntiGEN - Hepatitis C Antibody - Cytology -  PAP( [place])- Gono-Chlam  4. H/O herpes genitalis No recurrence of prophylaxis.  Valacyclovir 500 mg PO daily prescription sent to pharmacy.  Other orders - Norethindrone Acetate-Ethinyl Estrad-FE (HAILEY 24 FE) 1-20 MG-MCG(24) tablet; Take 1 tablet by mouth daily. - valACYclovir (VALTREX) 500 MG tablet; Take 1 tablet (500 mg total) by mouth daily.   Genia Del MD, 10:52 AM 11/05/2021

## 2021-11-06 LAB — HIV ANTIBODY (ROUTINE TESTING W REFLEX): HIV 1&2 Ab, 4th Generation: NONREACTIVE

## 2021-11-06 LAB — HEPATITIS B SURFACE ANTIGEN: Hepatitis B Surface Ag: NONREACTIVE

## 2021-11-06 LAB — RPR: RPR Ser Ql: NONREACTIVE

## 2021-11-06 LAB — HEPATITIS C ANTIBODY
Hepatitis C Ab: NONREACTIVE
SIGNAL TO CUT-OFF: 0.02 (ref <1.00)

## 2021-11-07 LAB — CYTOLOGY - PAP
Chlamydia: NEGATIVE
Comment: NEGATIVE
Comment: NORMAL
Diagnosis: NEGATIVE
Neisseria Gonorrhea: NEGATIVE

## 2021-11-11 ENCOUNTER — Ambulatory Visit (INDEPENDENT_AMBULATORY_CARE_PROVIDER_SITE_OTHER): Payer: 59 | Admitting: Behavioral Health

## 2021-11-11 ENCOUNTER — Other Ambulatory Visit: Payer: Self-pay

## 2021-11-11 ENCOUNTER — Encounter: Payer: Self-pay | Admitting: Behavioral Health

## 2021-11-11 DIAGNOSIS — F411 Generalized anxiety disorder: Secondary | ICD-10-CM | POA: Diagnosis not present

## 2021-11-11 DIAGNOSIS — F330 Major depressive disorder, recurrent, mild: Secondary | ICD-10-CM

## 2021-11-11 DIAGNOSIS — F3341 Major depressive disorder, recurrent, in partial remission: Secondary | ICD-10-CM

## 2021-11-11 MED ORDER — BUPROPION HCL ER (XL) 300 MG PO TB24: 300.0000 mg | ORAL_TABLET | Freq: Every day | ORAL | 2 refills | Status: DC

## 2021-11-11 NOTE — Progress Notes (Signed)
[facility]  Patient ID: Tina Cobb,  MRN: [id number]  PCP: Harrie Jeans, MD  Date of Evaluation: 11/11/2021 Time spent:30 minutes  Chief Complaint:  Chief Complaint   Depression; Follow-up; Medication Refill; Anxiety; Patient Education     HISTORY/CURRENT STATUS: HPI  21 year old female presents to this office for follow up medication management. She is prior patient of Dr. Domenick Gong.Says she has experienced significant improvement since adding Wellbutrin to her regimen. She is reporting increased energy levels and say she has no side effects. She is requesting increase of Wellbutrin hoping that it will continue to improve her symptoms of A&D. She is leaving for college next week to [employer].  Reports anxiety today at 2/10. Depression at 3/10. Reports sleeping 7-8 hours per day. No mania, psychosis. No SI/HI.    Past Psychiatric Medications: Zoloft      Individual Medical History/ Review of Systems: Changes? :No   Allergies: Bicillin [penicillin g benzathine]  Current Medications:  Current Outpatient Medications:    buPROPion (WELLBUTRIN XL) 300 MG 24 hr tablet, Take 1 tablet (300 mg total) by mouth daily., Disp: 30 tablet, Rfl: 2   acyclovir ointment (ZOVIRAX) 5 %, Apply 1 application topically every 3 (three) hours., Disp: 30 g, Rfl: 4   buPROPion (WELLBUTRIN XL) 150 MG 24 hr tablet, Take 1 tablet (150 mg total) by mouth daily., Disp: 30 tablet, Rfl: 1   escitalopram (LEXAPRO) 20 MG tablet, TAKE ONE TABLET BY MOUTH DAILY, Disp: 30 tablet, Rfl: 2   Multiple Vitamin (MULTIVITAMIN) tablet, Take 1 tablet by mouth daily., Disp: , Rfl:    Norethindrone Acetate-Ethinyl Estrad-FE (HAILEY 24 FE) 1-20 MG-MCG(24) tablet, Take 1 tablet by mouth daily., Disp: 84 tablet, Rfl: 4   valACYclovir (VALTREX) 500 MG tablet, Take 1 tablet (500 mg total) by mouth daily., Disp: 90 tablet, Rfl: 4 Medication Side Effects: none  Family Medical/ Social History: Changes?  No  MENTAL HEALTH EXAM:  There were no vitals taken for this visit.There is no height or weight on file to calculate BMI.  General Appearance: Bizarre and Neat  Eye Contact:  Good  Speech:  Clear and Coherent  Volume:  Normal  Mood:  Anxious and Depressed  Affect:  Depressed and Anxious  Thought Process:  Coherent  Orientation:  Full (Time, Place, and Person)  Thought Content: Logical   Suicidal Thoughts:  No  Homicidal Thoughts:  No  Memory:  WNL  Judgement:  Good  Insight:  Good  Psychomotor Activity:  Normal  Concentration:  Concentration: Good  Recall:  Good  Fund of Knowledge: Good  Language: Good  Assets:  Desire for Improvement  ADL's:  Intact  Cognition: WNL  Prognosis:  Good    DIAGNOSES:    ICD-10-CM   1. Generalized anxiety disorder  F41.1 buPROPion (WELLBUTRIN XL) 300 MG 24 hr tablet    2. Recurrent major depressive disorder, in partial remission (HCC)  F33.41 buPROPion (WELLBUTRIN XL) 300 MG 24 hr tablet    3. Mild recurrent major depression (HCC)  F33.0 buPROPion (WELLBUTRIN XL) 300 MG 24 hr tablet      Receiving Psychotherapy: No    RECOMMENDATIONS:  Continue dose of Lexapro 20 mg daily Increase Wellbutrin to 300 mg XL as adjunct for episodic and lingering depression. She is also reporting low energy. To report worsening condition promptly To follow up in 8 weeks to reassess. She is leaving for college and requesting Vid visit next time. Provided emergency contact information Greater  than 50% of face to face time with patient was spent on counseling and coordination of care. We discussed her current level of depression. She feels some relief from her A&D symptoms since initiating wellbutrin. She would like to consider dosage increase.  Educated pt on medication risk during pregnancy and continued BC.  Right now she is not sexually active. Educated pt on good sleep hygiene while at school and discussed blue light contamination before bedtime.  Reviewed  PDMP        Tina Brooklyn, NP

## 2021-12-19 ENCOUNTER — Other Ambulatory Visit: Payer: Self-pay | Admitting: Behavioral Health

## 2021-12-19 DIAGNOSIS — F330 Major depressive disorder, recurrent, mild: Secondary | ICD-10-CM

## 2021-12-19 DIAGNOSIS — F411 Generalized anxiety disorder: Secondary | ICD-10-CM

## 2022-01-12 ENCOUNTER — Encounter: Payer: Self-pay | Admitting: Behavioral Health

## 2022-01-12 ENCOUNTER — Telehealth (INDEPENDENT_AMBULATORY_CARE_PROVIDER_SITE_OTHER): Payer: 59 | Admitting: Behavioral Health

## 2022-01-12 DIAGNOSIS — F411 Generalized anxiety disorder: Secondary | ICD-10-CM

## 2022-01-12 DIAGNOSIS — F330 Major depressive disorder, recurrent, mild: Secondary | ICD-10-CM | POA: Diagnosis not present

## 2022-01-12 DIAGNOSIS — F3341 Major depressive disorder, recurrent, in partial remission: Secondary | ICD-10-CM | POA: Diagnosis not present

## 2022-01-12 MED ORDER — BUPROPION HCL ER (XL) 300 MG PO TB24: 300.0000 mg | ORAL_TABLET | Freq: Every day | ORAL | 2 refills | Status: DC

## 2022-01-12 MED ORDER — ESCITALOPRAM OXALATE 20 MG PO TABS: 20.0000 mg | ORAL_TABLET | Freq: Every day | ORAL | 2 refills | Status: DC

## 2022-01-12 NOTE — Progress Notes (Signed)
[address] [address] [place] ?[id number] ?2001/05/19 21 y.o. ? ?Virtual Visit via Video Note ? ?I connected with pt @ on 01/12/22 at  8:30 AM EDT by a video enabled telemedicine application and verified that I am speaking with the correct person using two identifiers. ?  ?I discussed the limitations of evaluation and management by telemedicine and the availability of in person appointments. The patient expressed understanding and agreed to proceed. ? ?I discussed the assessment and treatment plan with the patient. The patient was provided an opportunity to ask questions and all were answered. The patient agreed with the plan and demonstrated an understanding of the instructions. ?  ?The patient was advised to call back or seek an in-person evaluation if the symptoms worsen or if the condition fails to improve as anticipated. ? ?I provided 20 minutes of non-face-to-face time during this encounter.  The patient was located at home.  The provider was located at [facility]. ? ? ?Joan Flores, NP  ? ?Subjective:  ? ?Patient ID:  Tina Cobb is a 21 y.o. (DOB 28-Dec-2000) female. ? ?Chief Complaint:  ?Chief Complaint  ?Patient presents with  ? Anxiety  ? Depression  ? Follow-up  ? Medication Refill  ? ? ?HPI ?Tina Cobb presents for follow-up and medication management. ?She is prior patient of Dr. Shelba Flake.Says she has experienced significant improvement since increasing Wellbutrin to 300 mg. She is reporting increased energy levels and say she has no side effects. She feels like she is in a good place for now and is requesting no changes.  She is currently on spring break for school.  Reports anxiety today at 2/10. Depression at 2/10. Reports sleeping 7-8 hours per day. No mania, psychosis. No SI/HI.  ?  ?Past Psychiatric Medications: ?Zoloft ? ?Review of Systems:  ?Review of Systems ? ?Medications: I have reviewed the patient's current medications. ? ?Current Outpatient Medications  ?Medication Sig  Dispense Refill  ? acyclovir ointment (ZOVIRAX) 5 % Apply 1 application topically every 3 (three) hours. 30 g 4  ? buPROPion (WELLBUTRIN XL) 300 MG 24 hr tablet Take 1 tablet (300 mg total) by mouth daily. 30 tablet 2  ? escitalopram (LEXAPRO) 20 MG tablet Take 1 tablet (20 mg total) by mouth daily. 30 tablet 2  ? Multiple Vitamin (MULTIVITAMIN) tablet Take 1 tablet by mouth daily.    ? Norethindrone Acetate-Ethinyl Estrad-FE (HAILEY 24 FE) 1-20 MG-MCG(24) tablet Take 1 tablet by mouth daily. 84 tablet 4  ? valACYclovir (VALTREX) 500 MG tablet Take 1 tablet (500 mg total) by mouth daily. 90 tablet 4  ? ?No current facility-administered medications for this visit.  ? ? ?Medication Side Effects: None ? ?Allergies:  ?Allergies  ?Allergen Reactions  ? Bicillin [Penicillin G Benzathine] Swelling  ? ? ?Past Medical History:  ?Diagnosis Date  ? Anxiety   ? Depression   ? HSV infection   ? ? ?Family History  ?Problem Relation Age of Onset  ? Diabetes Father   ? Bipolar disorder Sister   ? Depression Brother   ? Anxiety disorder Brother   ? Cancer Maternal Grandfather   ?     BRAIN  ? Cancer Maternal Grandmother   ? Non-Hodgkin's lymphoma Maternal Grandmother   ? Diabetes Maternal Grandmother   ? Diabetes Paternal Grandfather   ? Diabetes Paternal Grandmother   ? ? ?Social History  ? ?Socioeconomic History  ? Marital status: Single  ?  Spouse name: Not on file  ? Number of  children: Not on file  ? Years of education: 14.5  ? Highest education level: Not on file  ?Occupational History  ? Occupation: Metallurgist  ?  Comment: [employer]  ?Tobacco Use  ? Smoking status: Never  ? Smokeless tobacco: Never  ?Vaping Use  ? Vaping Use: Never used  ?Substance and Sexual Activity  ? Alcohol use: Yes  ?  Comment: occ  ? Drug use: No  ? Sexual activity: Not Currently  ?  Partners: Male  ?  Birth control/protection: Pill  ?  Comment: 1st intercourse-16  ?Other Topics Concern  ? Not on file  ?Social History Narrative  ? Lives  at home with mother, father, and 74 year old brother. Family has a Development worker, international aid. 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 Not in a relationship at this time.  ? ?Social Determinants of Health  ? ?Financial Resource Strain: Not on file  ?Food Insecurity: Not on file  ?Transportation Needs: Not on file  ?Physical Activity: Not on file  ?Stress: Not on file  ?Social Connections: Not on file  ?Intimate Partner Violence: Not on file  ? ? ?Past Medical History, Surgical history, Social history, and Family history were reviewed and updated as appropriate.  ? ?Please see review of systems for further details on the patient's review from today.  ? ?Objective:  ? ?Physical Exam:  ?There were no vitals taken for this visit. ? ?Physical Exam ? ?Lab Review:  ?   ?Component Value Date/Time  ? NA 142 07/24/2016 0534  ? K 3.5 07/24/2016 0534  ? CL 100 (L) 07/24/2016 0534  ? CO2 27 07/24/2016 0534  ? GLUCOSE 102 (H) 07/24/2016 0534  ? BUN 6 07/24/2016 0534  ? CREATININE 0.95 07/24/2016 0534  ? CALCIUM 10.4 (H) 07/24/2016 0534  ? PROT 7.5 07/24/2016 0534  ? ALBUMIN 4.2 07/24/2016 0534  ? AST 25 07/24/2016 0534  ? ALT 28 07/24/2016 0534  ? ALKPHOS 68 07/24/2016 0534  ? BILITOT 1.2 07/24/2016 0534  ? GFRNONAA NOT CALCULATED 07/24/2016 0534  ? GFRAA NOT CALCULATED 07/24/2016 0534  ? ? ?   ?Component Value Date/Time  ? WBC 8.4 07/24/2016 0534  ? RBC 4.98 07/24/2016 0534  ? HGB 15.0 (H) 07/24/2016 0534  ? HCT 45.0 (H) 07/24/2016 0534  ? PLT 338 07/24/2016 0534  ? MCV 90.4 07/24/2016 0534  ? MCH 30.1 07/24/2016 0534  ? MCHC 33.3 07/24/2016 0534  ? RDW 12.6 07/24/2016 0534  ? ? ?No results found for: POCLITH, LITHIUM  ? ?No results found for: PHENYTOIN, PHENOBARB, VALPROATE, CBMZ  ? ?.res ?Assessment: Plan:   ? ?Tina Cobb was seen today for anxiety, depression, follow-up and medication refill. ? ?Diagnoses and all orders for this  visit: ? ?Generalized anxiety disorder ?-     escitalopram (LEXAPRO) 20 MG tablet; Take 1 tablet (20 mg total) by mouth daily. ?-     buPROPion (WELLBUTRIN XL) 300 MG 24 hr tablet; Take 1 tablet (300 mg total) by mouth daily. ? ?Recurrent major depressive disorder, in partial remission (HCC) ?-     buPROPion (WELLBUTRIN XL) 300 MG 24 hr tablet; Take 1 tablet (300 mg total) by mouth daily. ? ?Mild recurrent major depression (HCC) ?-     buPROPion (WELLBUTRIN XL) 300 MG 24 hr tablet; Take 1 tablet (300 mg total) by mouth daily. ? ?  ?Continue dose of Lexapro  20 mg daily ?Continue Wellbutrin to 300 mg XL as adjunct for episodic and lingering depression. Energy levels have improved since last visit. ?To report worsening condition promptly ?To follow up in 3 months to reassess. Provided emergency contact information ?Greater than 50% of face to face time with patient was spent on counseling and coordination of care. We discussed her current level of depression. Her anxiety and depression have improved significantly since last visit.  Educated pt on medication risk during pregnancy and continued BC.  Right now she is not sexually active. Educated pt on good sleep hygiene while at school and discussed blue light contamination before bedtime.  ?Reviewed PDMP ?  ? ?Please see After Visit Summary for patient specific instructions. ? ?No future appointments. ? ?No orders of the defined types were placed in this encounter. ? ? ?  ?-------------------------------  ?

## 2022-02-06 ENCOUNTER — Other Ambulatory Visit: Payer: Self-pay | Admitting: Behavioral Health

## 2022-02-06 DIAGNOSIS — F411 Generalized anxiety disorder: Secondary | ICD-10-CM

## 2022-02-06 DIAGNOSIS — F330 Major depressive disorder, recurrent, mild: Secondary | ICD-10-CM

## 2022-02-06 DIAGNOSIS — F3341 Major depressive disorder, recurrent, in partial remission: Secondary | ICD-10-CM

## 2022-04-14 ENCOUNTER — Telehealth (INDEPENDENT_AMBULATORY_CARE_PROVIDER_SITE_OTHER): Payer: 59 | Admitting: Behavioral Health

## 2022-04-14 ENCOUNTER — Encounter: Payer: Self-pay | Admitting: Behavioral Health

## 2022-04-14 DIAGNOSIS — F3341 Major depressive disorder, recurrent, in partial remission: Secondary | ICD-10-CM

## 2022-04-14 DIAGNOSIS — F330 Major depressive disorder, recurrent, mild: Secondary | ICD-10-CM

## 2022-04-14 DIAGNOSIS — F411 Generalized anxiety disorder: Secondary | ICD-10-CM

## 2022-04-14 MED ORDER — BUPROPION HCL ER (XL) 300 MG PO TB24: 300.0000 mg | ORAL_TABLET | Freq: Every day | ORAL | 3 refills | Status: AC

## 2022-04-14 MED ORDER — ESCITALOPRAM OXALATE 20 MG PO TABS: 20.0000 mg | ORAL_TABLET | Freq: Every day | ORAL | 1 refills | Status: AC

## 2022-04-14 NOTE — Progress Notes (Signed)
Tina Cobb [id number] 10-27-2001 21 y.o.  Virtual Visit via Video Note  I connected with pt @ on 04/14/22 at  8:00 AM EDT by a video enabled telemedicine application and verified that I am speaking with the correct person using two identifiers.   I discussed the limitations of evaluation and management by telemedicine and the availability of in person appointments. The patient expressed understanding and agreed to proceed.  I discussed the assessment and treatment plan with the patient. The patient was provided an opportunity to ask questions and all were answered. The patient agreed with the plan and demonstrated an understanding of the instructions.   The patient was advised to call back or seek an in-person evaluation if the symptoms worsen or if the condition fails to improve as anticipated.  I provided 20 minutes of non-face-to-face time during this encounter.  The patient was located at home.  The provider was located at [facility]   Joan FloresBrian A Mearl Harewood, NP   Subjective:   Patient ID:  Tina Cobb is a 21 y.o. (DOB 10-27-2001) female.  Chief Complaint: No chief complaint on file.   HPI  Tina Cobb presents for follow-up and medication management..Says she has experienced significant improvement since increasing Wellbutrin to 300 mg. She is reporting increased energy levels and say she has no side effects. She feels like she is in a good place for now and is requesting no changes.  She does have some increased anxiety anticipating new job but that is to be considered normal.  Reports anxiety today at 5/10. Depression at 3/10. Reports sleeping 7-8 hours per day  but is having some problems with broken sleep. She want to see if this improves before trying new medication. No mania, psychosis. No SI/HI.    Past Psychiatric Medications: Zoloft   Review of Systems:  Review of Systems  Medications: I have reviewed the patient's current  medications.  Current Outpatient Medications  Medication Sig Dispense Refill   buPROPion (WELLBUTRIN XL) 300 MG 24 hr tablet TAKE 1 TABLET BY MOUTH EVERY DAY 90 tablet 0   escitalopram (LEXAPRO) 20 MG tablet TAKE 1 TABLET BY MOUTH EVERY DAY 90 tablet 0   acyclovir ointment (ZOVIRAX) 5 % Apply 1 application topically every 3 (three) hours. 30 g 4   Multiple Vitamin (MULTIVITAMIN) tablet Take 1 tablet by mouth daily.     Norethindrone Acetate-Ethinyl Estrad-FE (HAILEY 24 FE) 1-20 MG-MCG(24) tablet Take 1 tablet by mouth daily. 84 tablet 4   valACYclovir (VALTREX) 500 MG tablet Take 1 tablet (500 mg total) by mouth daily. 90 tablet 4   No current facility-administered medications for this visit.    Medication Side Effects: None  Allergies:  Allergies  Allergen Reactions   Bicillin [Penicillin G Benzathine] Swelling    Past Medical History:  Diagnosis Date   Anxiety    Depression    HSV infection     Family History  Problem Relation Age of Onset   Diabetes Father    Bipolar disorder Sister    Depression Brother    Anxiety disorder Brother    Cancer Maternal Grandfather        BRAIN   Cancer Maternal Grandmother    Non-Hodgkin's lymphoma Maternal Grandmother    Diabetes Maternal Grandmother    Diabetes Paternal Grandfather    Diabetes Paternal Grandmother     Social History   Socioeconomic History   Marital status: Single    Spouse name: Not on file  Number of children: Not on file   Years of education: 14.5   Highest education level: Not on file  Occupational History   Occupation: Metallurgist    Comment: [employer]  Tobacco Use   Smoking status: Never   Smokeless tobacco: Never  Vaping Use   Vaping Use: Never used  Substance and Sexual Activity   Alcohol use: Yes    Comment: occ   Drug use: No   Sexual activity: Not Currently    Partners: Male    Birth control/protection: Pill    Comment: 1st intercourse-16  Other Topics Concern   Not on  file  Social History Narrative   Lives at home with mother, father, and 91 year old brother. Family has a Development worker, international aid. 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 Not in a relationship at this time.   Social Determinants of Health   Financial Resource Strain: Not on file  Food Insecurity: Not on file  Transportation Needs: Not on file  Physical Activity: Not on file  Stress: Not on file  Social Connections: Not on file  Intimate Partner Violence: Not on file    Past Medical History, Surgical history, Social history, and Family history were reviewed and updated as appropriate.   Please see review of systems for further details on the patient's review from today.   Objective:   Physical Exam:  There were no vitals taken for this visit.  Physical Exam  Lab Review:     Component Value Date/Time   NA 142 07/24/2016 0534   K 3.5 07/24/2016 0534   CL 100 (L) 07/24/2016 0534   CO2 27 07/24/2016 0534   GLUCOSE 102 (H) 07/24/2016 0534   BUN 6 07/24/2016 0534   CREATININE 0.95 07/24/2016 0534   CALCIUM 10.4 (H) 07/24/2016 0534   PROT 7.5 07/24/2016 0534   ALBUMIN 4.2 07/24/2016 0534   AST 25 07/24/2016 0534   ALT 28 07/24/2016 0534   ALKPHOS 68 07/24/2016 0534   BILITOT 1.2 07/24/2016 0534   GFRNONAA NOT CALCULATED 07/24/2016 0534   GFRAA NOT CALCULATED 07/24/2016 0534       Component Value Date/Time   WBC 8.4 07/24/2016 0534   RBC 4.98 07/24/2016 0534   HGB 15.0 (H) 07/24/2016 0534   HCT 45.0 (H) 07/24/2016 0534   PLT 338 07/24/2016 0534   MCV 90.4 07/24/2016 0534   MCH 30.1 07/24/2016 0534   MCHC 33.3 07/24/2016 0534   RDW 12.6 07/24/2016 0534    No results found for: "POCLITH", "LITHIUM"   No results found for: "PHENYTOIN", "PHENOBARB", "VALPROATE", "CBMZ"   .res Assessment: Plan:    Recommendations/Plan  Continue dose of Lexapro 20 mg daily Continue  Wellbutrin to 300 mg XL as adjunct for episodic and lingering depression. Energy levels have improved since last visit. To report worsening condition promptly To follow up in 6 weeks to reassess. Provided emergency contact information Greater than 50% of 20  min time via video visit with patient was spent on counseling and coordination of care. No significant changes since last visit.  We discussed her current level of depression. Her anxiety and depression have improved significantly since last visit. She does have some increased anxiety due to having new job which is normal.  Educated pt on medication risk during pregnancy and continued BC.  Right now she is not sexually active. Educated pt on good  sleep hygiene while at school and discussed blue light contamination before bedtime.  Reviewed PDMP   There are no diagnoses linked to this encounter.   Please see After Visit Summary for patient specific instructions.  No future appointments.  No orders of the defined types were placed in this encounter.     -------------------------------

## 2022-07-27 ENCOUNTER — Encounter: Payer: Self-pay | Admitting: Obstetrics & Gynecology

## 2022-12-13 ENCOUNTER — Other Ambulatory Visit: Payer: Self-pay | Admitting: Obstetrics & Gynecology

## 2022-12-14 NOTE — Telephone Encounter (Signed)
AEX 11/05/21. Scheduled 12/22/22

## 2022-12-22 ENCOUNTER — Other Ambulatory Visit (HOSPITAL_COMMUNITY)
Admission: RE | Admit: 2022-12-22 | Discharge: 2022-12-22 | Disposition: A | Discharge status: Home / Self Care | Payer: 59 | Source: Ambulatory Visit | Attending: Obstetrics & Gynecology | Admitting: Obstetrics & Gynecology

## 2022-12-22 ENCOUNTER — Ambulatory Visit (INDEPENDENT_AMBULATORY_CARE_PROVIDER_SITE_OTHER): Payer: 59 | Admitting: Obstetrics & Gynecology

## 2022-12-22 ENCOUNTER — Encounter: Payer: Self-pay | Admitting: Obstetrics & Gynecology

## 2022-12-22 VITALS — BP 100/64 | HR 85 | Ht 61.75 in | Wt 109.0 lb

## 2022-12-22 DIAGNOSIS — Z3041 Encounter for surveillance of contraceptive pills: Secondary | ICD-10-CM

## 2022-12-22 DIAGNOSIS — Z8619 Personal history of other infectious and parasitic diseases: Secondary | ICD-10-CM

## 2022-12-22 DIAGNOSIS — Z01419 Encounter for gynecological examination (general) (routine) without abnormal findings: Secondary | ICD-10-CM | POA: Diagnosis not present

## 2022-12-22 DIAGNOSIS — R5383 Other fatigue: Secondary | ICD-10-CM

## 2022-12-22 DIAGNOSIS — Z113 Encounter for screening for infections with a predominantly sexual mode of transmission: Secondary | ICD-10-CM | POA: Diagnosis not present

## 2022-12-22 DIAGNOSIS — Z23 Encounter for immunization: Secondary | ICD-10-CM | POA: Diagnosis not present

## 2022-12-22 MED ORDER — HAILEY 24 FE 1-20 MG-MCG(24) PO TABS: 1.0000 | ORAL_TABLET | Freq: Every day | ORAL | 4 refills | Status: AC

## 2022-12-22 MED ORDER — VALACYCLOVIR HCL 500 MG PO TABS: 500.0000 mg | ORAL_TABLET | Freq: Every day | ORAL | 4 refills | Status: AC

## 2022-12-22 NOTE — Progress Notes (Signed)
[place] [birth year] [id number]   History:    22 y.o.  G0 Single.  Photography major/Dance minor at [employer]   RP:  Established patient presenting for annual gyn exam    HPI: Well on norethindrone-estradiol FE 1/20.  No breakthrough bleeding.  No pelvic pain.  Occasionally sexually active. Genital herpes first episode in August 2020, rare recurrences on valacyclovir prophylaxis 500 mg daily since then.  Full STD screening desired today.  Breasts normal. Urine and bowel movements normal.  Fatigue, will check TSH and CBC.  Recommend a multivitamin and increased vegetable intake.  Body mass index increased to 20.10. Flu vaccine received today.  Past medical history,surgical history, family history and social history were all reviewed and documented in the EPIC chart.  Gynecologic History Patient's last menstrual period was 12/16/2022 (exact date).  Obstetric History OB History  Gravida Para Term Preterm AB Living  0 0 0 0 0 0  SAB IAB Ectopic Multiple Live Births  0 0 0 0 0     ROS: A ROS was performed and pertinent positives and negatives are included in the history. GENERAL: No fevers or chills. HEENT: No change in vision, no earache, sore throat or sinus congestion. NECK: No pain or stiffness. CARDIOVASCULAR: No chest pain or pressure. No palpitations. PULMONARY: No shortness of breath, cough or wheeze. GASTROINTESTINAL: No abdominal pain, nausea, vomiting or diarrhea, melena or bright red blood per rectum. GENITOURINARY: No urinary frequency, urgency, hesitancy or dysuria. MUSCULOSKELETAL: No joint or muscle pain, no back pain, no recent trauma. DERMATOLOGIC: No rash, no itching, no lesions. ENDOCRINE: No polyuria, polydipsia, no heat or cold intolerance. No recent change in weight. HEMATOLOGICAL: No anemia or easy bruising or bleeding. NEUROLOGIC: No headache, seizures, numbness, tingling or weakness. PSYCHIATRIC: No depression, no loss of interest in normal  activity or change in sleep pattern.     Exam:   BP 100/64   Pulse 85   Ht 5' 1.75" (1.568 m)   Wt 109 lb (49.4 kg)   LMP 12/16/2022 (Exact Date) Comment: ocp's  SpO2 99%   BMI 20.10 kg/m   Body mass index is 20.1 kg/m.  General appearance : Well developed well nourished female. No acute distress HEENT: Eyes: no retinal hemorrhage or exudates,  Neck supple, trachea midline, no carotid bruits, no thyroidmegaly Lungs: Clear to auscultation, no rhonchi or wheezes, or rib retractions  Heart: Regular rate and rhythm, no murmurs or gallops Breast:Examined in sitting and supine position were symmetrical in appearance, no palpable masses or tenderness,  no skin retraction, no nipple inversion, no nipple discharge, no skin discoloration, no axillary or supraclavicular lymphadenopathy Abdomen: no palpable masses or tenderness, no rebound or guarding Extremities: no edema or skin discoloration or tenderness  Pelvic: Vulva: Normal             Vagina: No gross lesions or discharge  Cervix: No gross lesions or discharge.  Pap reflex/Gono-Chlam done.  Uterus  AV, normal size, shape and consistency, non-tender and mobile  Adnexa  Without masses or tenderness  Anus: Normal   Assessment/Plan:  22 y.o. female for annual exam   1. Encounter for routine gynecological examination with Papanicolaou smear of cervix Well on norethindrone-estradiol FE 1/20.  No breakthrough bleeding.  No pelvic pain.  Occasionally sexually active. Genital herpes first episode in August 2020, rare recurrences on valacyclovir prophylaxis 500 mg daily since then.  Full STD screening desired today.  Breasts normal. Urine and bowel movements normal.  Fatigue,  will check TSH and CBC.  Recommend a multivitamin and increased vegetable intake.  Body mass index increased to 20.10. Flu vaccine received today. - Cytology - PAP( [place])  2. Encounter for surveillance of contraceptive pills Well on norethindrone-estradiol FE  1/20.  No breakthrough bleeding.  No pelvic pain.  Occasionally sexually active.   3. Screen for STD (sexually transmitted disease) - HIV antibody (with reflex) - RPR - Hepatitis B Surface AntiGEN - Hepatitis C Antibody - Cytology - PAP( [place])  4. H/O herpes genitalis Genital herpes first episode in August 2020, rare recurrences on valacyclovir prophylaxis 500 mg daily since then.   5. Fatigue, unspecified type Fatigue, will check TSH and CBC.  Recommend a multivitamin and increased vegetable intake.  Body mass index increased to 20.10.  - TSH - CBC  6. Need for immunization against influenza - Flu Vaccine QUAD 20moIM (Fluarix, Fluzone & Alfiuria Quad PF)  Other orders - valACYclovir (VALTREX) 500 MG tablet; Take 1 tablet (500 mg total) by mouth daily. - Norethindrone Acetate-Ethinyl Estrad-FE (HAILEY 24 FE) 1-20 MG-MCG(24) tablet; Take 1 tablet by mouth daily.   MPrincess BruinsMD, 3:09 PM

## 2022-12-23 LAB — CYTOLOGY - PAP
Chlamydia: NEGATIVE
Comment: NEGATIVE
Comment: NORMAL
Diagnosis: NEGATIVE
Neisseria Gonorrhea: NEGATIVE

## 2022-12-23 LAB — RPR: RPR Ser Ql: NONREACTIVE

## 2022-12-23 LAB — CBC
HCT: 40.7 % (ref 35.0–45.0)
Hemoglobin: 14 g/dL (ref 11.7–15.5)
MCH: 30.6 pg (ref 27.0–33.0)
MCHC: 34.4 g/dL (ref 32.0–36.0)
MCV: 89.1 fL (ref 80.0–100.0)
MPV: 9.6 fL (ref 7.5–12.5)
Platelets: 391 10*3/uL (ref 140–400)
RBC: 4.57 10*6/uL (ref 3.80–5.10)
RDW: 12.2 % (ref 11.0–15.0)
WBC: 7.4 10*3/uL (ref 3.8–10.8)

## 2022-12-23 LAB — HIV ANTIBODY (ROUTINE TESTING W REFLEX): HIV 1&2 Ab, 4th Generation: NONREACTIVE

## 2022-12-23 LAB — TSH: TSH: 1.81 mIU/L

## 2022-12-23 LAB — HEPATITIS C ANTIBODY: Hepatitis C Ab: NONREACTIVE

## 2022-12-23 LAB — HEPATITIS B SURFACE ANTIGEN: Hepatitis B Surface Ag: NONREACTIVE
